# Patient Record
Sex: Male | Born: 1970 | Race: White | Hispanic: No | Marital: Single
Health system: Southern US, Community
[De-identification: ages and names within clinical notes are randomized; demographics above are authoritative.]

---

## 2009-11-23 ENCOUNTER — Ambulatory Visit: Payer: Self-pay | Admitting: Family Medicine

## 2011-12-30 IMAGING — CR DG CHEST 2V
1 series · 2 of 2 positions shown · non-contrast
Comparison: none

REASON FOR EXAM: interlostral  strain
COMMENTS:

PROCEDURE:     MDR - MDR CHEST PA(OR AP) AND LATERAL  - November 23, 2009  [DATE]
RESULT:     The lung fields are clear. No pneumonia, pneumothorax or pleural
effusion is seen. No interstitial or pulmonary edema is noted. The heart is
within normal limits for size.

[Series 1: view not recorded · 0.17mm/px · 2 of 2 slices shown]
[im 1/2]
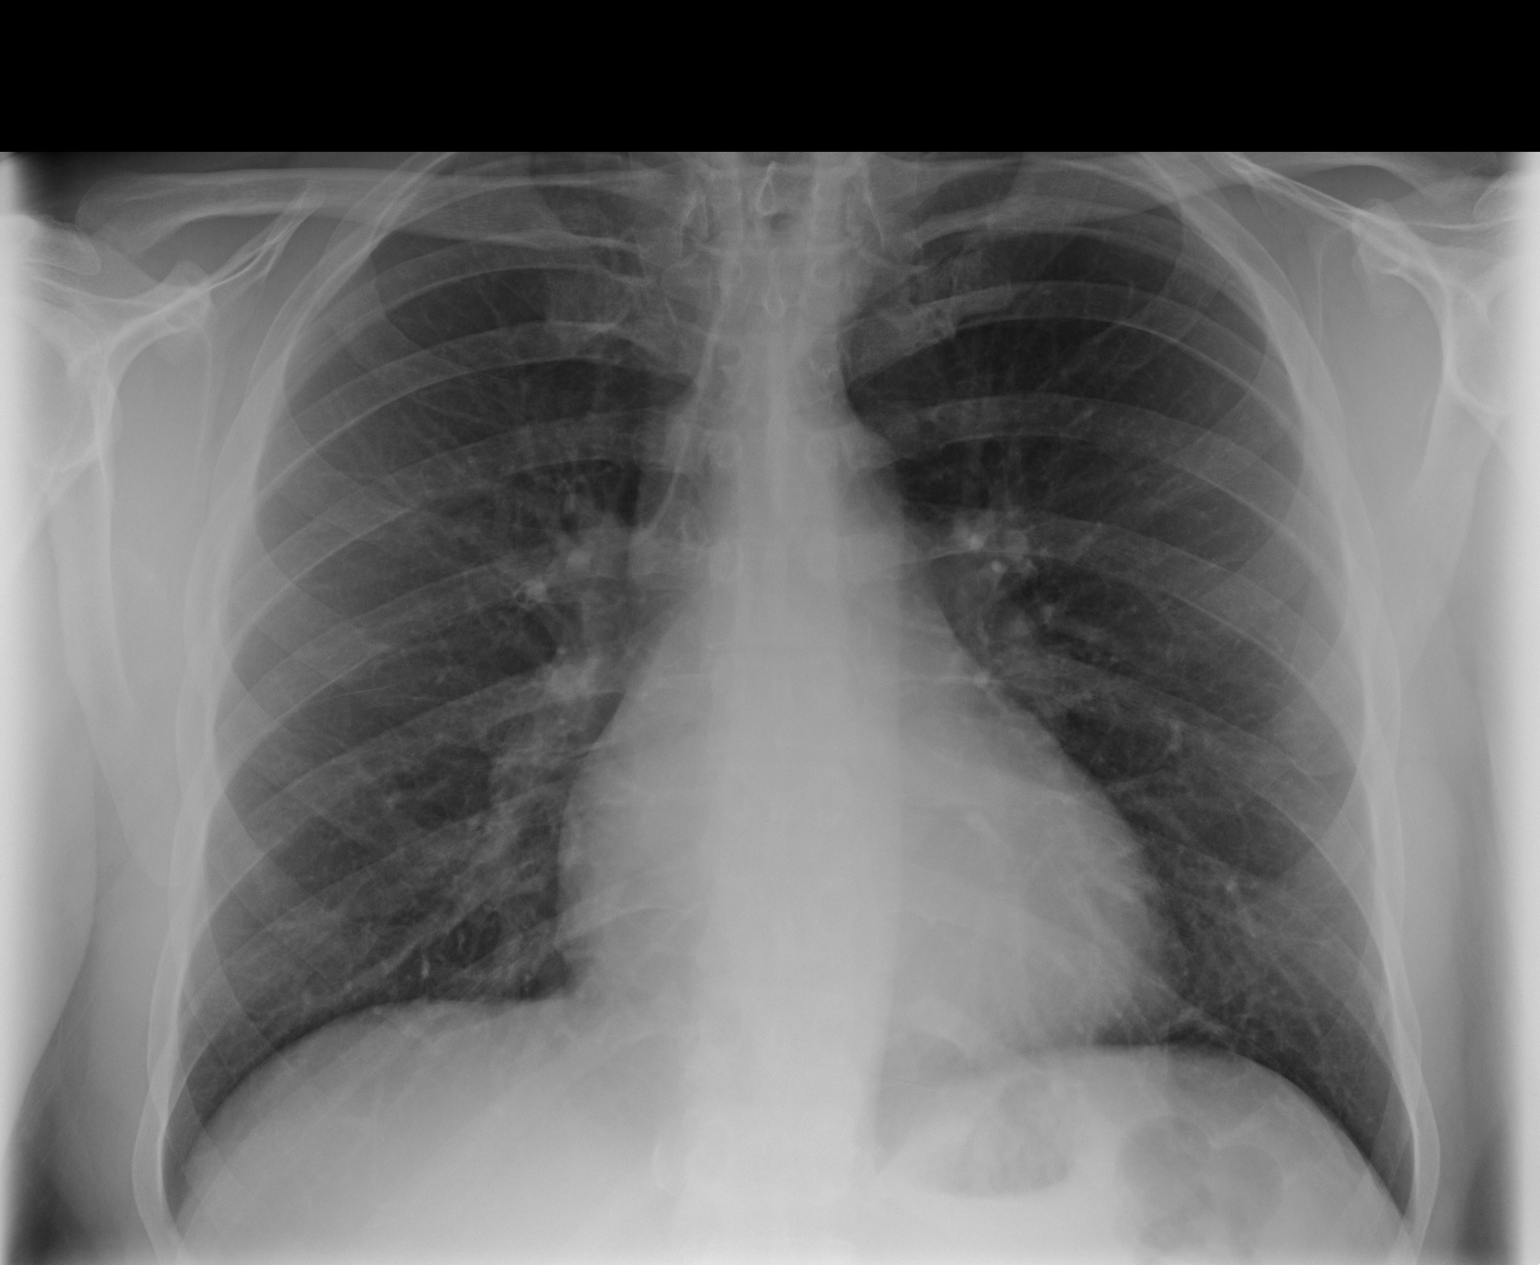
[im 2/2]
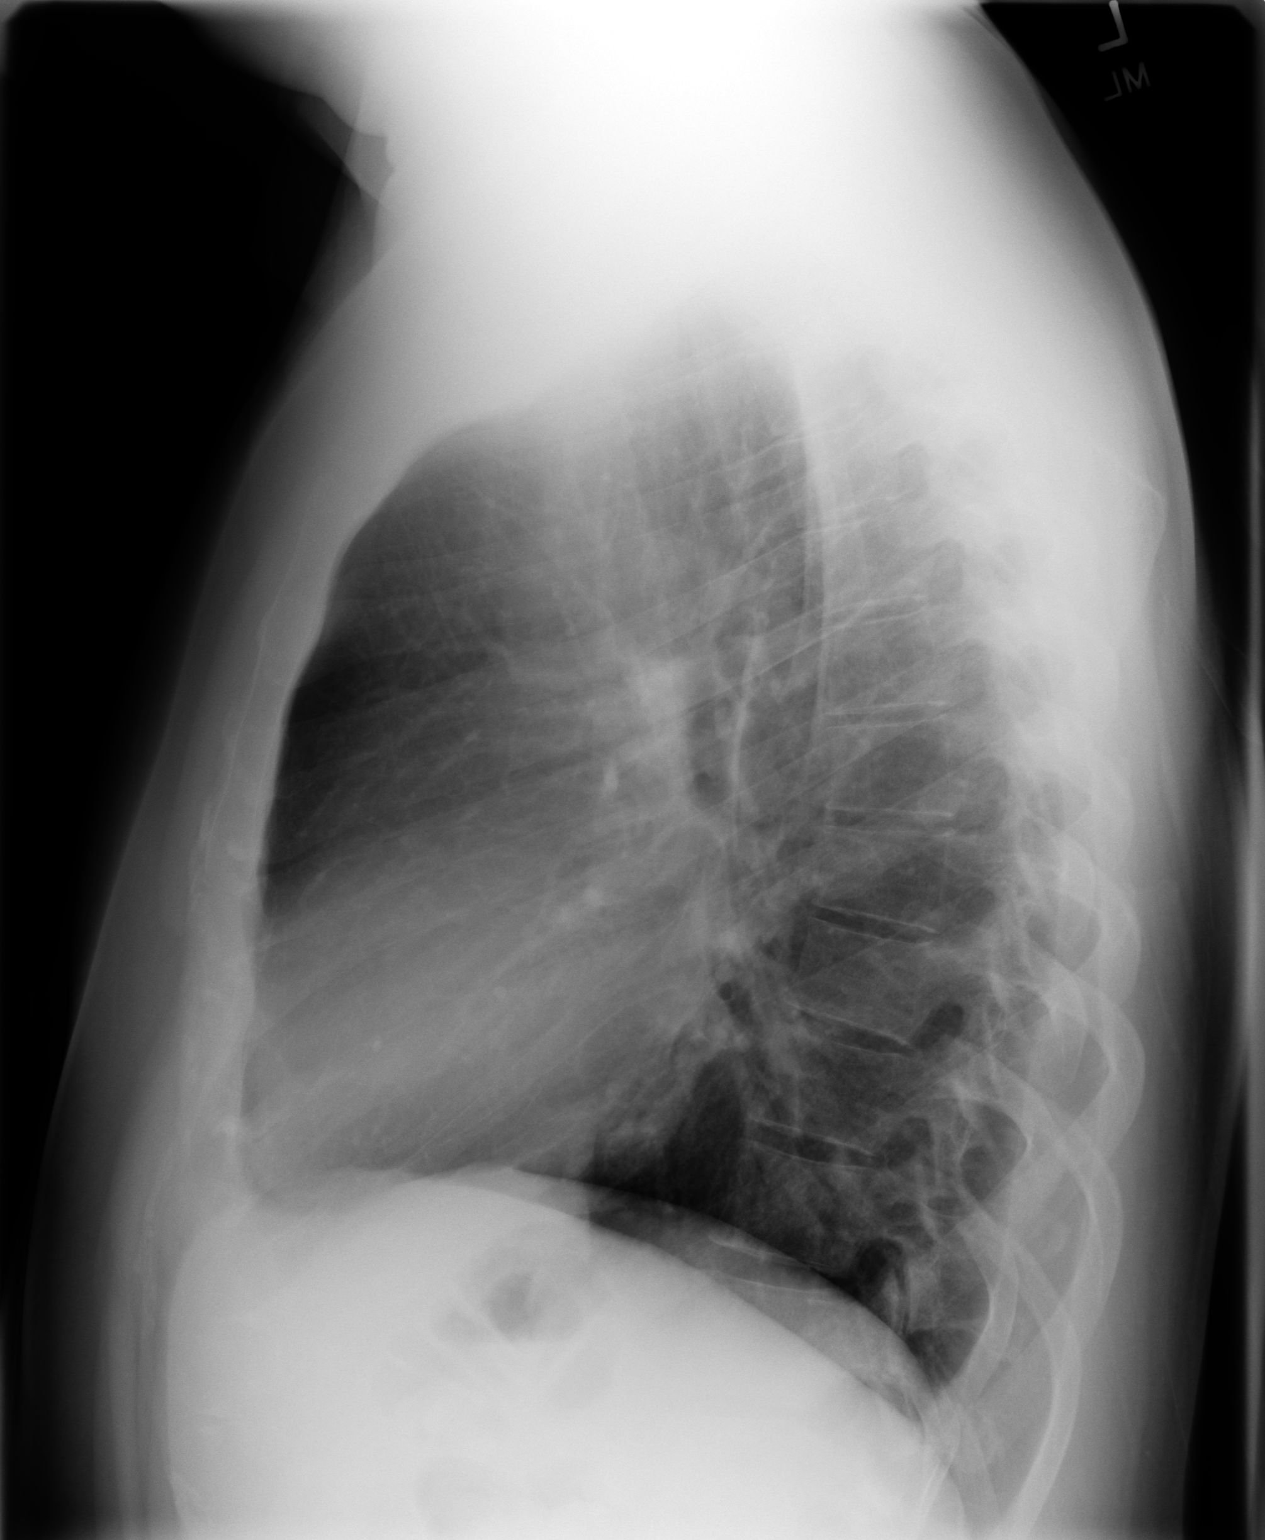

[2 of 2 positions shown; findings below may reference images not displayed]

IMPRESSION: No acute changes are identified.

## 2017-05-23 ENCOUNTER — Ambulatory Visit
Admission: RE | Admit: 2017-05-23 | Discharge: 2017-05-23 | Disposition: A | Discharge status: Home / Self Care | Payer: Worker's Compensation | Source: Ambulatory Visit | Attending: Physician Assistant | Admitting: Physician Assistant

## 2017-05-23 ENCOUNTER — Other Ambulatory Visit: Payer: Self-pay | Admitting: Physician Assistant

## 2017-05-23 DIAGNOSIS — X58XXXA Exposure to other specified factors, initial encounter: Secondary | ICD-10-CM | POA: Diagnosis not present

## 2017-05-23 DIAGNOSIS — S61303A Unspecified open wound of left middle finger with damage to nail, initial encounter: Secondary | ICD-10-CM | POA: Insufficient documentation

## 2017-05-23 DIAGNOSIS — M79645 Pain in left finger(s): Secondary | ICD-10-CM

## 2017-06-02 ENCOUNTER — Encounter: Payer: Worker's Compensation | Attending: Surgery | Admitting: Surgery

## 2017-06-02 DIAGNOSIS — X58XXXA Exposure to other specified factors, initial encounter: Secondary | ICD-10-CM | POA: Diagnosis not present

## 2017-06-02 DIAGNOSIS — F17218 Nicotine dependence, cigarettes, with other nicotine-induced disorders: Secondary | ICD-10-CM | POA: Insufficient documentation

## 2017-06-02 DIAGNOSIS — S62663B Nondisplaced fracture of distal phalanx of left middle finger, initial encounter for open fracture: Secondary | ICD-10-CM | POA: Insufficient documentation

## 2017-06-03 NOTE — Progress Notes (Signed)
[name], Hunter K. ([id number]) Visit Report for 06/02/2017 Abuse/Suicide Risk Screen Details Patient Name: [name], Hunter K. Date of Service: 06/02/2017 9:00 AM Medical Record Number: [id number] Patient Account Number: [id number] Date of Birth/Sex: 01/29/1971 [age] y.o. Male) Treating RN: Curtis Sitesorthy, Joanna Primary Care Adamary Savary: PATIENT, NO Other Clinician: Referring Alahna Dunne: Janalyn HarderSINGER, SAMANTHA Treating Dmetrius Ambs/Extender: Rudene ReBritto, Errol Weeks in Treatment: 0 Abuse/Suicide Risk Screen Items Answer ABUSE/SUICIDE RISK SCREEN: Has anyone close to you tried to hurt or harm you recentlyo No Do you feel uncomfortable with anyone in your familyo No Has anyone forced you do things that you didnot want to doo No Do you have any thoughts of harming yourselfo No Patient displays signs or symptoms of abuse and/or neglect. No Electronic Signature(s) Signed: 06/02/2017 4:57:04 PM By: Curtis Sitesorthy, Joanna Entered By: Curtis Sitesorthy, Joanna on 06/02/2017 08:48:00 [name], Hunter K. ([id number]) -------------------------------------------------------------------------------- Activities of Daily Living Details Patient Name: [name], Hunter K. Date of Service: 06/02/2017 9:00 AM Medical Record Number: [id number] Patient Account Number: [id number] Date of Birth/Sex: 02/27/1971 [age] y.o. Male) Treating RN: Curtis Sitesorthy, Joanna Primary Care Vicie Cech: PATIENT, NO Other Clinician: Referring Caleb Prigmore: Janalyn HarderSINGER, SAMANTHA Treating Kathyjo Briere/Extender: Rudene ReBritto, Errol Weeks in Treatment: 0 Activities of Daily Living Items Answer Activities of Daily Living (Please select one for each item) Drive Automobile Completely Able Take Medications Completely Able Use Telephone Completely Able Care for Appearance Completely Able Use Toilet Completely Able Bath / Shower Completely Able Dress Self Completely Able Feed Self Completely Able Walk Completely Able Get In / Out Bed Completely Able Housework Completely Able Prepare Meals Completely  Able Handle Money Completely Able Shop for Self Completely Able Electronic Signature(s) Signed: 06/02/2017 4:57:04 PM By: Curtis Sitesorthy, Joanna Entered By: Curtis Sitesorthy, Joanna on 06/02/2017 08:48:05 [name], Hunter K. ([id number]) -------------------------------------------------------------------------------- Education Assessment Details Patient Name: [name], Hunter K. Date of Service: 06/02/2017 9:00 AM Medical Record Number: [id number] Patient Account Number: [id number] Date of Birth/Sex: 04/28/1971 [age] y.o. Male) Treating RN: Curtis Sitesorthy, Joanna Primary Care Loleta Frommelt: PATIENT, NO Other Clinician: Referring Miyako Oelke: Janalyn HarderSINGER, SAMANTHA Treating Jaimarie Rapozo/Extender: Rudene ReBritto, Errol Weeks in Treatment: 0 Primary Learner Assessed: Patient Learning Preferences/Education Level/Primary Language Learning Preference: Explanation, Demonstration Highest Education Level: College or Above Preferred Language: English Cognitive Barrier Assessment/Beliefs Language Barrier: No Translator Needed: No Memory Deficit: No Emotional Barrier: No Cultural/Religious Beliefs Affecting Medical Care: No Physical Barrier Assessment Impaired Vision: No Impaired Hearing: No Decreased Hand dexterity: No Knowledge/Comprehension Assessment Knowledge Level: Medium Comprehension Level: Medium Ability to understand written Medium instructions: Ability to understand verbal Medium instructions: Motivation Assessment Anxiety Level: Calm Cooperation: Cooperative Education Importance: Acknowledges Need Interest in Health Problems: Asks Questions Perception: Coherent Willingness to Engage in Self- Medium Management Activities: Readiness to Engage in Self- Medium Management Activities: Electronic Signature(s) Signed: 06/02/2017 4:57:04 PM By: Curtis Sitesorthy, Joanna Entered By: Curtis Sitesorthy, Joanna on 06/02/2017 08:48:09 [name], Hunter K. ([id number]) -------------------------------------------------------------------------------- Fall Risk  Assessment Details Patient Name: [name], Hunter K. Date of Service: 06/02/2017 9:00 AM Medical Record Number: [id number] Patient Account Number: [id number] Date of Birth/Sex: 06/26/1970 [age] y.o. Male) Treating RN: Curtis Sitesorthy, Joanna Primary Care Lamere Lightner: PATIENT, NO Other Clinician: Referring Geronimo Diliberto: Janalyn HarderSINGER, SAMANTHA Treating Chizuko Trine/Extender: Rudene ReBritto, Errol Weeks in Treatment: 0 Fall Risk Assessment Items Have you had 2 or more falls in the last 12 monthso 0 No Have you had any fall that resulted in injury in the last 12 monthso 0 No FALL RISK ASSESSMENT: History of falling - immediate or within 3 months 0 No Secondary diagnosis 0 No Ambulatory aid None/bed rest/wheelchair/nurse 0 Yes Crutches/cane/walker 0 No Furniture 0 No IV Access/Saline  Lock 0 No Gait/Training Normal/bed rest/immobile 0 Yes Weak 0 No Impaired 0 No Mental Status Oriented to own ability 0 Yes Electronic Signature(s) Signed: 06/02/2017 4:57:04 PM By: Curtis Sitesorthy, Joanna Entered By: Curtis Sitesorthy, Joanna on 06/02/2017 08:48:13 [name], Radford K. ([id number]) -------------------------------------------------------------------------------- Foot Assessment Details Patient Name: [name], Vale K. Date of Service: 06/02/2017 9:00 AM Medical Record Number: [id number] Patient Account Number: [id number] Date of Birth/Sex: 08/05/1970 [age] y.o. Male) Treating RN: Curtis Sitesorthy, Joanna Primary Care Demontrae Gilbert: PATIENT, NO Other Clinician: Referring Jobina Maita: Janalyn HarderSINGER, SAMANTHA Treating Aurora Rody/Extender: Rudene ReBritto, Errol Weeks in Treatment: 0 Foot Assessment Items Site Locations + = Sensation present, - = Sensation absent, C = Callus, U = Ulcer R = Redness, W = Warmth, M = Maceration, PU = Pre-ulcerative lesion F = Fissure, S = Swelling, D = Dryness Assessment Right: Left: Other Deformity: No No Prior Foot Ulcer: No No Prior Amputation: No No Charcot Joint: No No Ambulatory Status: Ambulatory Without Help Gait: Steady Electronic  Signature(s) Signed: 06/02/2017 4:57:04 PM By: Curtis Sitesorthy, Joanna Entered By: Curtis Sitesorthy, Joanna on 06/02/2017 08:48:37 [name], Bradin K. ([id number]) -------------------------------------------------------------------------------- Nutrition Risk Assessment Details Patient Name: [name], Tucker K. Date of Service: 06/02/2017 9:00 AM Medical Record Number: [id number] Patient Account Number: [id number] Date of Birth/Sex: 07/14/1970 [age] y.o. Male) Treating RN: Curtis Sitesorthy, Joanna Primary Care Lavenia Stumpo: PATIENT, NO Other Clinician: Referring Shamiracle Gorden: Janalyn HarderSINGER, SAMANTHA Treating Maison Agrusa/Extender: Rudene ReBritto, Errol Weeks in Treatment: 0 Height (in): 76 Weight (lbs): 235 Body Mass Index (BMI): 28.6 Nutrition Risk Assessment Items NUTRITION RISK SCREEN: I have an illness or condition that made me change the kind and/or amount of 0 No food I eat I eat fewer than two meals per day 0 No I eat few fruits and vegetables, or milk products 0 No I have three or more drinks of beer, liquor or wine almost every day 0 No I have tooth or mouth problems that make it hard for me to eat 0 No I don't always have enough money to buy the food I need 0 No I eat alone most of the time 0 No I take three or more different prescribed or over-the-counter drugs a day 0 No Without wanting to, I have lost or gained 10 pounds in the last six months 0 No I am not always physically able to shop, cook and/or feed myself 0 No Nutrition Protocols Good Risk Protocol 0 No interventions needed Moderate Risk Protocol Electronic Signature(s) Signed: 06/02/2017 4:57:04 PM By: Curtis Sitesorthy, Joanna Entered By: Curtis Sitesorthy, Joanna on 06/02/2017 08:48:17

## 2017-06-03 NOTE — Progress Notes (Signed)
NORI, POLAND ([id number]) Visit Report for 06/02/2017 Allergy List Details Patient Name: [name], [name]. Date of Service: 06/02/2017 9:00 AM Medical Record Number: [id number] Patient Account Number: [id number] Date of Birth/Sex: 10-Jul-1970 (46 y.o. Male) Treating RN: Curtis Sites Primary Care Dandra Shambaugh: PATIENT, NO Other Clinician: Referring Conlan Miceli: Janalyn Harder Treating Blinda Turek/Extender: Rudene Re in Treatment: 0 Allergies Active Allergies No Known Allergies Allergy Notes Electronic Signature(s) Signed: 06/02/2017 4:57:04 PM By: Curtis Sites Entered By: Curtis Sites on 06/02/2017 08:42:07 [name] ([id number]) -------------------------------------------------------------------------------- Arrival Information Details Patient Name: [name] Date of Service: 06/02/2017 9:00 AM Medical Record Number: [id number] Patient Account Number: [id number] Date of Birth/Sex: [birth year] (46 y.o. Male) Treating RN: Curtis Sites Primary Care Shadrick Senne: PATIENT, NO Other Clinician: Referring Omaya Nieland: Janalyn Harder Treating Khristian Seals/Extender: Rudene Re in Treatment: 0 Visit Information Patient Arrived: Ambulatory Arrival Time: 08:37 Accompanied By: self Transfer Assistance: None Patient Identification Verified: Yes Secondary Verification Process Completed: Yes Electronic Signature(s) Signed: 06/02/2017 4:57:04 PM By: Curtis Sites Entered By: Curtis Sites on 06/02/2017 08:37:58 [name] ([id number]) -------------------------------------------------------------------------------- Clinic Level of Care Assessment Details Patient Name: [name] Date of Service: 06/02/2017 9:00 AM Medical Record Number: [id number] Patient Account Number: [id number] Date of Birth/Sex: [birth year] (46 y.o. Male) Treating RN: Curtis Sites Primary Care Radhika Dershem: PATIENT, NO Other Clinician: Referring Allante Beane: Janalyn Harder Treating  Laporcha Marchesi/Extender: Rudene Re in Treatment: 0 Clinic Level of Care Assessment Items TOOL 2 Quantity Score []  - Use when only an EandM is performed on the INITIAL visit 0 ASSESSMENTS - Nursing Assessment / Reassessment X - General Physical Exam (combine w/ comprehensive assessment (listed just below) when 1 20 performed on new pt. evals) X- 1 25 Comprehensive Assessment (HX, ROS, Risk Assessments, Wounds Hx, etc.) ASSESSMENTS - Wound and Skin Assessment / Reassessment X - Simple Wound Assessment / Reassessment - one wound 1 5 []  - 0 Complex Wound Assessment / Reassessment - multiple wounds []  - 0 Dermatologic / Skin Assessment (not related to wound area) ASSESSMENTS - Ostomy and/or Continence Assessment and Care []  - Incontinence Assessment and Management 0 []  - 0 Ostomy Care Assessment and Management (repouching, etc.) PROCESS - Coordination of Care X - Simple Patient / Family Education for ongoing care 1 15 []  - 0 Complex (extensive) Patient / Family Education for ongoing care X- 1 10 Staff obtains Chiropractor, Records, Test Results / Process Orders []  - 0 Staff telephones HHA, Nursing Homes / Clarify orders / etc []  - 0 Routine Transfer to another Facility (non-emergent condition) []  - 0 Routine Hospital Admission (non-emergent condition) X- 1 15 New Admissions / Manufacturing engineer / Ordering NPWT, Apligraf, etc. []  - 0 Emergency Hospital Admission (emergent condition) X- 1 10 Simple Discharge Coordination []  - 0 Complex (extensive) Discharge Coordination PROCESS - Special Needs []  - Pediatric / Minor Patient Management 0 []  - 0 Isolation Patient Management OAK, DOREY ([id number]) []  - 0 Hearing / Language / Visual special needs []  - 0 Assessment of Community assistance (transportation, D/C planning, etc.) []  - 0 Additional assistance / Altered mentation []  - 0 Support Surface(s) Assessment (bed, cushion, seat, etc.) INTERVENTIONS - Wound  Cleansing / Measurement X - Wound Imaging (photographs - any number of wounds) 1 5 []  - 0 Wound Tracing (instead of photographs) X- 1 5 Simple Wound Measurement - one wound []  - 0 Complex Wound Measurement - multiple wounds X- 1 5 Simple Wound Cleansing - one wound []  - 0 Complex Wound Cleansing - multiple  wounds INTERVENTIONS - Wound Dressings X - Small Wound Dressing one or multiple wounds 1 10 []  - 0 Medium Wound Dressing one or multiple wounds []  - 0 Large Wound Dressing one or multiple wounds []  - 0 Application of Medications - injection INTERVENTIONS - Miscellaneous []  - External ear exam 0 []  - 0 Specimen Collection (cultures, biopsies, blood, body fluids, etc.) []  - 0 Specimen(s) / Culture(s) sent or taken to Lab for analysis []  - 0 Patient Transfer (multiple staff / Michiel SitesHoyer Lift / Similar devices) []  - 0 Simple Staple / Suture removal (25 or less) []  - 0 Complex Staple / Suture removal (26 or more) []  - 0 Hypo / Hyperglycemic Management (close monitor of Blood Glucose) []  - 0 Ankle / Brachial Index (ABI) - do not check if billed separately Has the patient been seen at the hospital within the last three years: Yes Total Score: 125 Level Of Care: New/Established - Level 4 Electronic Signature(s) Signed: 06/02/2017 4:57:04 PM By: Curtis Sitesorthy, Joanna Entered By: Curtis Sitesorthy, Joanna on 06/02/2017 10:40:24 Hunter ClarksSTACEY, Hunter K. ([id number]) -------------------------------------------------------------------------------- Encounter Discharge Information Details Patient Name: Hunter ClarksSTACEY, Hunter K. Date of Service: 06/02/2017 9:00 AM Medical Record Number: [id number] Patient Account Number: [id number] Date of Birth/Sex: 07/04/1970 [age] y.o. Male) Treating RN: Curtis Sitesorthy, Joanna Primary Care Rmani Kapusta: PATIENT, NO Other Clinician: Referring Khamiyah Grefe: Janalyn HarderSINGER, SAMANTHA Treating Malaiya Paczkowski/Extender: Rudene ReBritto, Errol Weeks in Treatment: 0 Encounter Discharge Information Items Discharge Pain Level:  0 Discharge Condition: Stable Ambulatory Status: Ambulatory Discharge Destination: Home Transportation: Private Auto Accompanied By: self Schedule Follow-up Appointment: Yes Medication Reconciliation completed and No provided to Patient/Care Giuseppe Duchemin: Provided on Clinical Summary of Care: 06/02/2017 Form Type Recipient Paper Patient TS Electronic Signature(s) Signed: 06/02/2017 10:41:23 AM By: Curtis Sitesorthy, Joanna Entered By: Curtis Sitesorthy, Joanna on 06/02/2017 10:41:22 Hunter ClarksSTACEY, Hunter K. ([id number]) -------------------------------------------------------------------------------- Multi Wound Chart Details Patient Name: Hunter ClarksSTACEY, Hunter K. Date of Service: 06/02/2017 9:00 AM Medical Record Number: [id number] Patient Account Number: [id number] Date of Birth/Sex: 07/20/1970 [age] y.o. Male) Treating RN: Curtis Sitesorthy, Joanna Primary Care Noeh Sparacino: PATIENT, NO Other Clinician: Referring Jamayah Myszka: Janalyn HarderSINGER, SAMANTHA Treating Elgar Scoggins/Extender: Rudene ReBritto, Errol Weeks in Treatment: 0 Vital Signs Height(in): 76 Pulse(bpm): 68 Weight(lbs): 235 Blood Pressure(mmHg): 160/90 Body Mass Index(BMI): 29 Temperature(F): 98.2 Respiratory Rate 18 (breaths/min): Photos: [1:No Photos] [N/A:N/A] Wound Location: [1:Left Hand - 3rd Digit] [N/A:N/A] Wounding Event: [1:Trauma] [N/A:N/A] Primary Etiology: [1:Trauma, Other] [N/A:N/A] Date Acquired: [1:05/23/2017] [N/A:N/A] Weeks of Treatment: [1:0] [N/A:N/A] Wound Status: [1:Open] [N/A:N/A] Pending Amputation on [1:Yes] [N/A:N/A] Presentation: Measurements L x W x D [1:3x1.5x0.1] [N/A:N/A] (cm) Area (cm) : [1:3.534] [N/A:N/A] Volume (cm) : [1:0.353] [N/A:N/A] Classification: [1:Full Thickness With Exposed Support Structures] [N/A:N/A] Exudate Amount: [1:Large] [N/A:N/A] Exudate Type: [1:Serous] [N/A:N/A] Exudate Color: [1:amber] [N/A:N/A] Wound Margin: [1:Flat and Intact] [N/A:N/A] Granulation Amount: [1:Large (67-100%)] [N/A:N/A] Granulation Quality: [1:Red]  [N/A:N/A] Necrotic Amount: [1:Small (1-33%)] [N/A:N/A] Exposed Structures: [1:Fat Layer (Subcutaneous Tissue) Exposed: Yes Fascia: No Tendon: No Muscle: No Joint: No Bone: No] [N/A:N/A] Epithelialization: [1:None] [N/A:N/A] Periwound Skin Texture: [1:Excoriation: No Induration: No Callus: No Crepitus: No Rash: No Scarring: No] [N/A:N/A] Periwound Skin Moisture: [N/A:N/A] Maceration: Yes Dry/Scaly: No Periwound Skin Color: Atrophie Blanche: No N/A N/A Cyanosis: No Ecchymosis: No Erythema: No Hemosiderin Staining: No Mottled: No Pallor: No Rubor: No Temperature: No Abnormality N/A N/A Tenderness on Palpation: Yes N/A N/A Wound Preparation: Ulcer Cleansing: N/A N/A Rinsed/Irrigated with Saline Topical Anesthetic Applied: Other: lidocaine 4% Treatment Notes Electronic Signature(s) Signed: 06/02/2017 4:57:04 PM By: Curtis Sitesorthy, Joanna Previous Signature: 06/02/2017 9:45:05 AM Version By: Evlyn KannerBritto, Errol MD, FACS Entered  By: Curtis Sitesorthy, Joanna on 06/02/2017 10:27:56 Hunter ClarksSTACEY, Hunter K. ([id number]) -------------------------------------------------------------------------------- Multi-Disciplinary Care Plan Details Patient Name: Hunter ClarksSTACEY, Hunter K. Date of Service: 06/02/2017 9:00 AM Medical Record Number: [id number] Patient Account Number: [id number] Date of Birth/Sex: 10/01/1970 [age] y.o. Male) Treating RN: Curtis Sitesorthy, Joanna Primary Care Ruthy Forry: PATIENT, NO Other Clinician: Referring Demetrica Zipp: Janalyn HarderSINGER, SAMANTHA Treating Wissam Resor/Extender: Rudene ReBritto, Errol Weeks in Treatment: 0 Active Inactive ` Orientation to the Wound Care Program Nursing Diagnoses: Knowledge deficit related to the wound healing center program Goals: Patient/caregiver will verbalize understanding of the Wound Healing Center Program Date Initiated: 06/02/2017 Target Resolution Date: 08/12/2017 Goal Status: Active Interventions: Provide education on orientation to the wound center Notes: ` Wound/Skin Impairment Nursing  Diagnoses: Impaired tissue integrity Goals: Ulcer/skin breakdown will heal within 14 weeks Date Initiated: 06/02/2017 Target Resolution Date: 08/12/2017 Goal Status: Active Interventions: Assess patient/caregiver ability to obtain necessary supplies Assess patient/caregiver ability to perform ulcer/skin care regimen upon admission and as needed Assess ulceration(s) every visit Notes: Electronic Signature(s) Signed: 06/02/2017 4:57:04 PM By: Curtis Sitesorthy, Joanna Entered By: Curtis Sitesorthy, Joanna on 06/02/2017 10:27:35 Hunter ClarksSTACEY, Hasaan K. ([id number]) -------------------------------------------------------------------------------- Pain Assessment Details Patient Name: Hunter ClarksSTACEY, Hunter K. Date of Service: 06/02/2017 9:00 AM Medical Record Number: [id number] Patient Account Number: [id number] Date of Birth/Sex: 08/07/1970 [age] y.o. Male) Treating RN: Curtis Sitesorthy, Joanna Primary Care Cristie Mckinney: PATIENT, NO Other Clinician: Referring Derrian Poli: Janalyn HarderSINGER, SAMANTHA Treating Kalla Watson/Extender: Rudene ReBritto, Errol Weeks in Treatment: 0 Active Problems Location of Pain Severity and Description of Pain Patient Has Paino Yes Site Locations Pain Location: Pain in Ulcers With Dressing Change: Yes Duration of the Pain. Constant / Intermittento Intermittent Pain Management and Medication Current Pain Management: Notes Topical or injectable lidocaine is offered to patient for acute pain when surgical debridement is performed. If needed, Patient is instructed to use over the counter pain medication for the following 24-48 hours after debridement. Wound care MDs do not prescribed pain medications. Patient has chronic pain or uncontrolled pain. Patient has been instructed to make an appointment with their Primary Care Physician for pain management. Electronic Signature(s) Signed: 06/02/2017 4:57:04 PM By: Curtis Sitesorthy, Joanna Entered By: Curtis Sitesorthy, Joanna on 06/02/2017 08:38:44 Hunter ClarksSTACEY, Hunter K.  ([id number]) -------------------------------------------------------------------------------- Patient/Caregiver Education Details Patient Name: Hunter ClarksSTACEY, Hunter K. Date of Service: 06/02/2017 9:00 AM Medical Record Number: [id number] Patient Account Number: [id number] Date of Birth/Gender: 08/16/1970 [age] y.o. Male) Treating RN: Curtis Sitesorthy, Joanna Primary Care Physician: PATIENT, NO Other Clinician: Referring Physician: Janalyn HarderSINGER, SAMANTHA Treating Physician/Extender: Rudene ReBritto, Errol Weeks in Treatment: 0 Education Assessment Education Provided To: Patient Education Topics Provided Wound/Skin Impairment: Handouts: Other: wound care as ordered Methods: Demonstration, Explain/Verbal Responses: State content correctly Electronic Signature(s) Signed: 06/02/2017 4:57:04 PM By: Curtis Sitesorthy, Joanna Entered By: Curtis Sitesorthy, Joanna on 06/02/2017 10:41:37 Hunter ClarksSTACEY, Hunter K. ([id number]) -------------------------------------------------------------------------------- Wound Assessment Details Patient Name: Hunter ClarksSTACEY, Leovanni K. Date of Service: 06/02/2017 9:00 AM Medical Record Number: [id number] Patient Account Number: [id number] Date of Birth/Sex: 11/19/1970 [age] y.o. Male) Treating RN: Curtis Sitesorthy, Joanna Primary Care Malique Driskill: PATIENT, NO Other Clinician: Referring Rutherford Alarie: Janalyn HarderSINGER, SAMANTHA Treating Hisae Decoursey/Extender: Rudene ReBritto, Errol Weeks in Treatment: 0 Wound Status Wound Number: 1 Primary Etiology: Trauma, Other Wound Location: Left Hand - 3rd Digit Wound Status: Open Wounding Event: Trauma Date Acquired: 05/23/2017 Weeks Of Treatment: 0 Clustered Wound: No Pending Amputation On Presentation Photos Photo Uploaded By: Curtis Sitesorthy, Joanna on 06/02/2017 11:18:06 Wound Measurements Length: (cm) 3 Width: (cm) 1.5 Depth: (cm) 0.1 Area: (cm) 3.534 Volume: (cm) 0.353 % Reduction in Area: % Reduction in Volume: Epithelialization: None Tunneling: No Undermining: No Wound Description Full Thickness With  Exposed  Support Classification: Structures Wound Margin: Flat and Intact Exudate Large Amount: Exudate Type: Serous Exudate Color: amber Foul Odor After Cleansing: No Slough/Fibrino Yes Wound Bed Granulation Amount: Large (67-100%) Exposed Structure Granulation Quality: Red Fascia Exposed: No Necrotic Amount: Small (1-33%) Fat Layer (Subcutaneous Tissue) Exposed: Yes Necrotic Quality: Adherent Slough Tendon Exposed: No Muscle Exposed: No Joint Exposed: No ELIGIO, ANGERT. ([id number]) Bone Exposed: No Periwound Skin Texture Texture Color No Abnormalities Noted: No No Abnormalities Noted: No Callus: No Atrophie Blanche: No Crepitus: No Cyanosis: No Excoriation: No Ecchymosis: No Induration: No Erythema: No Rash: No Hemosiderin Staining: No Scarring: No Mottled: No Pallor: No Moisture Rubor: No No Abnormalities Noted: No Dry / Scaly: No Temperature / Pain Maceration: Yes Temperature: No Abnormality Tenderness on Palpation: Yes Wound Preparation Ulcer Cleansing: Rinsed/Irrigated with Saline Topical Anesthetic Applied: Other: lidocaine 4%, Treatment Notes Wound #1 (Left Hand - 3rd Digit) 1. Cleansed with: Clean wound with Normal Saline 2. Anesthetic Topical Lidocaine 4% cream to wound bed prior to debridement 4. Dressing Applied: Other dressing (specify in notes) 5. Secondary Dressing Applied Gauze and Kerlix/Conform 7. Secured with Tape Notes silvercel, netting Electronic Signature(s) Signed: 06/02/2017 4:57:04 PM By: Curtis Sites Entered By: Curtis Sites on 06/02/2017 08:59:44 [name] ([id number]) -------------------------------------------------------------------------------- Vitals Details Patient Name: [name] Date of Service: 06/02/2017 9:00 AM Medical Record Number: [id number] Patient Account Number: [id number] Date of Birth/Sex: 1970/06/25 (46 y.o. Male) Treating RN: Curtis Sites Primary Care Jianni Batten: PATIENT, NO Other  Clinician: Referring Aparna Vanderweele: Janalyn Harder Treating Kimberlea Schlag/Extender: Rudene Re in Treatment: 0 Vital Signs Time Taken: 08:38 Temperature (F): 98.2 Height (in): 76 Pulse (bpm): 68 Source: Measured Respiratory Rate (breaths/min): 18 Weight (lbs): 235 Blood Pressure (mmHg): 160/90 Source: Measured Reference Range: 80 - 120 mg / dl Body Mass Index (BMI): 28.6 Electronic Signature(s) Signed: 06/02/2017 4:57:04 PM By: Curtis Sites Entered By: Curtis Sites on 06/02/2017 08:41:48

## 2017-06-03 NOTE — Progress Notes (Signed)
[name], Krish K. ([id number]) Visit Report for 06/02/2017 Chief Complaint Document Details Patient Name: [name], Hunter K. Date of Service: 06/02/2017 9:00 AM Medical Record Number: [id number] Patient Account Number: [id number] Date of Birth/Sex: 03/25/1971 19(46 y.o. Male) Treating RN: Curtis Sitesorthy, Joanna Primary Care Provider: PATIENT, NO Other Clinician: Referring Provider: Janalyn HarderSINGER, SAMANTHA Treating Provider/Extender: Rudene ReBritto, Yolanda Huffstetler Weeks in Treatment: 0 Information Obtained from: Patient Chief Complaint Patient seen for complaints of Non-Healing Wound to the distal phalanx of the left middle finger which she's had since 05/23/2017 Electronic Signature(s) Signed: 06/02/2017 9:45:30 AM By: Evlyn KannerBritto, Sagal Gayton MD, FACS Entered By: Evlyn KannerBritto, Tamekia Rotter on 06/02/2017 09:45:30 [name], Raphael K. ([id number]) -------------------------------------------------------------------------------- HPI Details Patient Name: [name], Hunter K. Date of Service: 06/02/2017 9:00 AM Medical Record Number: [id number] Patient Account Number: [id number] Date of Birth/Sex: 09/29/1970 [age] y.o. Male) Treating RN: Curtis Sitesorthy, Joanna Primary Care Provider: PATIENT, NO Other Clinician: Referring Provider: Janalyn HarderSINGER, SAMANTHA Treating Provider/Extender: Rudene ReBritto, [place]ath Madding Weeks in Treatment: 0 History of Present Illness Location: and left middle finger Quality: Patient reports experiencing a dull pain to affected area(s). Severity: Patient states wound are getting worse. Duration: Patient has had the wound for < 2 weeks prior to presenting for treatment Timing: Pain in wound is Intermittent (comes and goes Context: The wound occurred when the patient got his finger crushed in a grinder while he was at work and has [place] [employer] [place] [employer] [place]. Modifying Factors: Other treatment(s) tried include:x-ray taken and put on an antibiotic Associated Signs and Symptoms: Patient reports having increase swelling. HPI Description: this  46 year old young man was referred by the [employer] team at [facility]  and has a injury to the left middle finger distal phalanx on 05/23/2017. He sustained a crush injury in a grinder and the distal tip of the left middle finger was ripped and torn off including the fingernail. He had an x-ray of the left hand IMPRESSION:1. Middle finger distal soft tissue laceration with loss of soft tissue to the finger tip. Subtle evidence of minimal fracturing of the distal margin of the distal tuft. No other fracture. No dislocation. No radiopaque foreign body. the patient does not have any significant past medical history but is a smoker and smokes about a pack of cigarettes a day. The patient was prescribed Cefadroxil 500 mg twice a day for 7 days and given some dressing changes and asked to see the wound care. the patient was not referred to hand a Engineer, petroleumplastic surgeon. Electronic Signature(s) Signed: 06/02/2017 9:45:35 AM By: Evlyn KannerBritto, Debhora Titus MD, FACS Previous Signature: 06/02/2017 9:32:32 AM Version By: Evlyn KannerBritto, Renn Dirocco MD, FACS Entered By: Evlyn KannerBritto, Woodard Perrell on 06/02/2017 09:45:34 [name], Tregan K. ([id number]) -------------------------------------------------------------------------------- Physical Exam Details Patient Name: [name], Hunter K. Date of Service: 06/02/2017 9:00 AM Medical Record Number: [id number] Patient Account Number: [id number] Date of Birth/Sex: 08/06/1970 [age] y.o. Male) Treating RN: Curtis Sitesorthy, Joanna Primary Care Provider: PATIENT, NO Other Clinician: Referring Provider: Janalyn HarderSINGER, SAMANTHA Treating Provider/Extender: Rudene ReBritto, Casey Maxfield Weeks in Treatment: 0 Constitutional . Pulse regular. Respirations normal and unlabored. Afebrile. . Eyes Nonicteric. Reactive to light. Ears, Nose, Mouth, and Throat Lips, teeth, and gums WNL.Marland Kitchen. Moist mucosa without lesions. Neck supple and nontender. No palpable supraclavicular or cervical  adenopathy. Normal sized without goiter. Respiratory WNL. No retractions.. Cardiovascular Pedal Pulses WNL. No clubbing, cyanosis or edema. Gastrointestinal (GI) Abdomen without masses or tenderness.. No liver or spleen enlargement or tenderness.. Lymphatic No adneopathy. No adenopathy. No adenopathy. Musculoskeletal Adexa without tenderness or enlargement.. Digits and  nails w/o clubbing, cyanosis, infection, petechiae, ischemia, or inflammatory conditions.. Integumentary (Hair, Skin) No suspicious lesions. No crepitus or fluctuance. No peri-wound warmth or erythema. No masses.Marland Kitchen Psychiatric Judgement and insight Intact.. No evidence of depression, anxiety, or agitation.. Notes the patient has a degloving injury of the distal phalanx of the left middle finger and has no nail. No tendons or bone seen or palpated. He does not have any evidence of restricted range of movement except for the distal phalanx. No debridement required. Electronic Signature(s) Signed: 06/02/2017 9:46:42 AM By: Evlyn Kanner MD, FACS Entered By: Evlyn Kanner on 06/02/2017 09:46:41 Hunter Clarks ([id number]) -------------------------------------------------------------------------------- Physician Orders Details Patient Name: [name]. Date of Service: 06/02/2017 9:00 AM Medical Record Number: [id number] Patient Account Number: [id number] Date of Birth/Sex: 1971-05-24 (46 y.o. Male) Treating RN: Curtis Sites Primary Care Provider: PATIENT, NO Other Clinician: Referring Provider: Janalyn Harder Treating Provider/Extender: Rudene Re in Treatment: 0 Verbal / Phone Orders: No Diagnosis Coding Wound Cleansing Wound #1 Left Hand - 3rd Digit o Clean wound with Normal Saline. o May Shower, gently pat wound dry prior to applying new dressing. Primary Wound Dressing Wound #1 Left Hand - 3rd Digit o Silvercel Non-Adherent Secondary Dressing Wound #1 Left Hand - 3rd Digit o Gauze  and Kerlix/Conform Dressing Change Frequency Wound #1 Left Hand - 3rd Digit o Change dressing every day. Follow-up Appointments Wound #1 Left Hand - 3rd Digit o Return Appointment in 1 week. Additional Orders / Instructions Wound #1 Left Hand - 3rd Digit o Stop Smoking o Vitamin A; Vitamin C, Zinc - over the counter supplements o Increase protein intake. Consults o General Surgery - [facility] to [facility] Electronic Signature(s) Signed: 06/02/2017 1:42:42 PM By: Evlyn Kanner MD, FACS Signed: 06/02/2017 4:57:04 PM By: Curtis Sites Entered By: Curtis Sites on 06/02/2017 09:29:50 Hunter Clarks ([id number]) -------------------------------------------------------------------------------- Problem List Details Patient Name: EMMANUEL, GRUENHAGEN. Date of Service: 06/02/2017 9:00 AM Medical Record Number: [id number] Patient Account Number: [id number] Date of Birth/Sex: [birth year] (46 y.o. Male) Treating RN: Curtis Sites Primary Care Provider: PATIENT, NO Other Clinician: Referring Provider: Janalyn Harder Treating Provider/Extender: Rudene Re in Treatment: 0 Active Problems ICD-10 Encounter Code Description Active Date Diagnosis S61.303A Unspecified open wound of left middle finger with damage to nail, 06/02/2017 Yes initial encounter S62.663B Nondisplaced fracture of distal phalanx of left middle finger, initial 06/02/2017 Yes encounter for open fracture F17.218 Nicotine dependence, cigarettes, with other nicotine-induced 06/02/2017 Yes disorders Inactive Problems Resolved Problems Electronic Signature(s) Signed: 06/02/2017 9:44:54 AM By: Evlyn Kanner MD, FACS Entered By: Evlyn Kanner on 06/02/2017 09:44:54 Hunter Clarks ([id number]) -------------------------------------------------------------------------------- Progress Note Details Patient Name: Hunter Clarks Date of Service: 06/02/2017 9:00 AM Medical Record Number:  [id number] Patient Account Number: [id number] Date of Birth/Sex: [birth year] (46 y.o. Male) Treating RN: Curtis Sites Primary Care Provider: PATIENT, NO Other Clinician: Referring Provider: Janalyn Harder Treating Provider/Extender: Rudene Re in Treatment: 0 Subjective Chief Complaint Information obtained from Patient Patient seen for complaints of Non-Healing Wound to the distal phalanx of the left middle finger which she's had since 05/23/2017 History of Present Illness (HPI) The following HPI elements were documented for the patient's wound: Location: and left middle finger Quality: Patient reports experiencing a dull pain to affected area(s). Severity: Patient states wound are getting worse. Duration: Patient has had the wound for < 2 weeks prior to presenting for treatment Timing: Pain in wound is Intermittent (comes and goes Context: The wound occurred when  the patient got his finger crushed in a grinder while he was at work and has seen the [employer] unit at [employer]. Modifying Factors: Other treatment(s) tried include:x-ray taken and put on an antibiotic Associated Signs and Symptoms: Patient reports having increase swelling. this 46 year old young man was referred by the [employer] team at [facility] employee health and wellness at [facility] and has a injury to the left middle finger distal phalanx on 05/23/2017. He sustained a crush injury in a grinder and the distal tip of the left middle finger was ripped and torn off including the fingernail. He had an x-ray of the left hand IMPRESSION:1. Middle finger distal soft tissue laceration with loss of soft tissue to the finger tip. Subtle evidence of minimal fracturing of the distal margin of the distal tuft. No other fracture. No dislocation. No radiopaque foreign body. the patient does not have any significant past medical history but is a smoker and smokes about a pack of cigarettes a  day. The patient was prescribed Cefadroxil 500 mg twice a day for 7 days and given some dressing changes and asked to see the wound care. the patient was not referred to hand a Engineer, petroleum. Wound History Patient presents with 1 open wound that has been present for approximately 10 days. Patient has been treating wound in the following manner: dry bandage. Laboratory tests have not been performed in the last month. Patient reportedly has not tested positive for an antibiotic resistant organism. Patient reportedly has not tested positive for osteomyelitis. Patient reportedly has not had testing performed to evaluate circulation in the legs. Patient History Information obtained from Patient. Allergies No Known Allergies Family History No family history of Cancer, Diabetes, Heart Disease, Hereditary Spherocytosis, Hypertension, Kidney Disease, Lung Disease, Seizures, Stroke, Thyroid Problems, Tuberculosis. Social History DERYL, GIROUX ([id number]) Current every day smoker - 1/2 pack to 1 pack, Marital Status - Single, Alcohol Use - Rarely, Drug Use - Current History - marijana occasionally, Caffeine Use - Daily. Medical History Hematologic/Lymphatic Denies history of Anemia, Hemophilia, Human Immunodeficiency Virus, Lymphedema, Sickle Cell Disease Respiratory Denies history of Aspiration, Asthma, Chronic Obstructive Pulmonary Disease (COPD), Pneumothorax, Sleep Apnea, Tuberculosis Cardiovascular Denies history of Angina, Arrhythmia, Congestive Heart Failure, Coronary Artery Disease, Deep Vein Thrombosis, Hypertension, Hypotension, Myocardial Infarction, Peripheral Arterial Disease, Peripheral Venous Disease, Phlebitis, Vasculitis Gastrointestinal Denies history of Cirrhosis , Colitis, Crohn s, Hepatitis A, Hepatitis B, Hepatitis C Endocrine Denies history of Type I Diabetes, Type II Diabetes Genitourinary Denies history of End Stage Renal Disease Immunological Denies history  of Lupus Erythematosus, Raynaud s, Scleroderma Integumentary (Skin) Denies history of History of Burn, History of pressure wounds Musculoskeletal Denies history of Gout, Rheumatoid Arthritis, Osteoarthritis, Osteomyelitis Neurologic Denies history of Dementia, Neuropathy, Quadriplegia, Paraplegia, Seizure Disorder Oncologic Denies history of Received Chemotherapy, Received Radiation Psychiatric Denies history of Anorexia/bulimia, Confinement Anxiety Review of Systems (ROS) Constitutional Symptoms (General Health) The patient has no complaints or symptoms. Eyes Complains or has symptoms of Glasses / Contacts - glasses. Ear/Nose/Mouth/Throat The patient has no complaints or symptoms. Hematologic/Lymphatic The patient has no complaints or symptoms. Respiratory The patient has no complaints or symptoms. Cardiovascular The patient has no complaints or symptoms. Gastrointestinal Denies complaints or symptoms of Frequent diarrhea, Nausea, Vomiting. Endocrine Denies complaints or symptoms of Hepatitis, Thyroid disease, Polydypsia (Excessive Thirst). Genitourinary The patient has no complaints or symptoms. Immunological Denies complaints or symptoms of Hives, Itching. Integumentary (Skin) Complains or has symptoms of Wounds. Denies complaints or symptoms  of Bleeding or bruising tendency, Breakdown, Swelling. Musculoskeletal Denies complaints or symptoms of Muscle Pain, Muscle Weakness. Neurologic Denies complaints or symptoms of Numbness/parasthesias, Focal/Weakness. MARQUIE, ADERHOLD ([id number]) Oncologic The patient has no complaints or symptoms. Psychiatric Denies complaints or symptoms of Anxiety, Claustrophobia. Medications : he has completed his courses of antibiotics and occasionally takes the tramadol. Does not have any regular medications Objective Constitutional Pulse regular. Respirations normal and unlabored. Afebrile. Vitals Time Taken: 8:38 AM, Height: 76 in,  Source: Measured, Weight: 235 lbs, Source: Measured, BMI: 28.6, Temperature: 98.2 F, Pulse: 68 bpm, Respiratory Rate: 18 breaths/min, Blood Pressure: 160/90 mmHg. Eyes Nonicteric. Reactive to light. Ears, Nose, Mouth, and Throat Lips, teeth, and gums WNL.Marland Kitchen Moist mucosa without lesions. Neck supple and nontender. No palpable supraclavicular or cervical adenopathy. Normal sized without goiter. Respiratory WNL. No retractions.. Cardiovascular Pedal Pulses WNL. No clubbing, cyanosis or edema. Gastrointestinal (GI) Abdomen without masses or tenderness.. No liver or spleen enlargement or tenderness.. Lymphatic No adneopathy. No adenopathy. No adenopathy. Musculoskeletal Adexa without tenderness or enlargement.. Digits and nails w/o clubbing, cyanosis, infection, petechiae, ischemia, or inflammatory conditions.Marland Kitchen Psychiatric Judgement and insight Intact.. No evidence of depression, anxiety, or agitation.. General Notes: the patient has a degloving injury of the distal phalanx of the left middle finger and has no nail. No tendons or bone seen or palpated. He does not have any evidence of restricted range of movement except for the distal phalanx. No debridement required. Integumentary (Hair, Skin) Loseke, Kha K. ([id number]) No suspicious lesions. No crepitus or fluctuance. No peri-wound warmth or erythema. No masses.. Wound #1 status is Open. Original cause of wound was Trauma. The wound is located on the Left Hand - 3rd Digit. The wound measures 3cm length x 1.5cm width x 0.1cm depth; 3.534cm^2 area and 0.353cm^3 volume. There is Fat Layer (Subcutaneous Tissue) Exposed exposed. There is no tunneling or undermining noted. There is a large amount of serous drainage noted. The wound margin is flat and intact. There is large (67-100%) red granulation within the wound bed. There is a small (1-33%) amount of necrotic tissue within the wound bed including Adherent Slough. The periwound skin  appearance exhibited: Maceration. The periwound skin appearance did not exhibit: Callus, Crepitus, Excoriation, Induration, Rash, Scarring, Dry/Scaly, Atrophie Blanche, Cyanosis, Ecchymosis, Hemosiderin Staining, Mottled, Pallor, Rubor, Erythema. Periwound temperature was noted as No Abnormality. The periwound has tenderness on palpation. Assessment Active Problems ICD-10 S61.303A - Unspecified open wound of left middle finger with damage to nail, initial encounter S62.663B - Nondisplaced fracture of distal phalanx of left middle finger, initial encounter for open fracture F17.218 - Nicotine dependence, cigarettes, with other nicotine-induced disorders this 46 year old gentleman with a degloving injury to the left middle finger has a subtle fracture which is nondisplaced. After review of his wound I have recommended: 1. Silver alginate and a light dressing over the distal phalanx to be changed daily or every other day 2. Hand surgeon to review for possible closure of the wound with a full-thickness skin graft. I believe this will help him in the healing of this wound which is otherwise going to have a prolonged recovery time 3. Continue with mobility of his hand and discussed appropriate methods to keep the wound dry during the day 4. Adequate protein, vitamin A, vitamin C and zinc 5. Have spent 3 minutes discussing with him the need to completely give up smoking and I discussed the risks benefits alternatives and all the possible complications of not doing so. 6. Regular visits  to the wound center and in his care has been taken over by the hand surgeon Plan Wound Cleansing: Wound #1 Left Hand - 3rd Digit: Clean wound with Normal Saline. May Shower, gently pat wound dry prior to applying new dressing. Primary Wound Dressing: Wound #1 Left Hand - 3rd Digit: Silvercel Non-Adherent Secondary Dressing: Wound #1 Left Hand - 3rd Digit: Gauze and Kerlix/Conform Dressing Change  Frequency: Wound #1 Left Hand - 3rd Digit: Change dressing every day. Follow-up Appointments: Wound #1 Left Hand - 3rd Digit: HRISHIKESH, HOEG. ([id number]) Return Appointment in 1 week. Additional Orders / Instructions: Wound #1 Left Hand - 3rd Digit: Stop Smoking Vitamin A; Vitamin C, Zinc - over the counter supplements Increase protein intake. Consults ordered were: General Surgery - Referral to [facility] this 46 year old gentleman with a degloving injury to the left middle finger has a subtle fracture which is nondisplaced. After review of his wound I have recommended: 1. Silver alginate and a light dressing over the distal phalanx to be changed daily or every other day 2. Hand surgeon to review for possible closure of the wound with a full-thickness skin graft. I believe this will help him in the healing of this wound which is otherwise going to have a prolonged recovery time 3. Continue with mobility of his hand and discussed appropriate methods to keep the wound dry during the day 4. Adequate protein, vitamin A, vitamin C and zinc 5. Have spent 3 minutes discussing with him the need to completely give up smoking and I discussed the risks benefits alternatives and all the possible complications of not doing so. 6. Regular visits to the wound center and in his care has been taken over by the hand surgeon Electronic Signature(s) Signed: 06/02/2017 9:49:37 AM By: Evlyn Kanner MD, FACS Entered By: Evlyn Kanner on 06/02/2017 09:49:37 Hunter Clarks ([id number]) -------------------------------------------------------------------------------- ROS/PFSH Details Patient Name: Hunter Clarks. Date of Service: 06/02/2017 9:00 AM Medical Record Number: [id number] Patient Account Number: [id number] Date of Birth/Sex: 25-Dec-1970 (46 y.o. Male) Treating RN: Curtis Sites Primary Care Provider: PATIENT, NO Other Clinician: Referring Provider: Janalyn Harder Treating  Provider/Extender: Rudene Re in Treatment: 0 Information Obtained From Patient Wound History Do you currently have one or more open woundso Yes How many open wounds do you currently haveo 1 Approximately how long have you had your woundso 10 days How have you been treating your wound(s) until nowo dry bandage Has your wound(s) ever healed and then re-openedo No Have you had any lab work done in the past montho No Have you tested positive for an antibiotic resistant organism (MRSA, VRE)o No Have you tested positive for osteomyelitis (bone infection)o No Have you had any tests for circulation on your legso No Eyes Complaints and Symptoms: Positive for: Glasses / Contacts - glasses Gastrointestinal Complaints and Symptoms: Negative for: Frequent diarrhea; Nausea; Vomiting Medical History: Negative for: Cirrhosis ; Colitis; Crohnos; Hepatitis A; Hepatitis B; Hepatitis C Endocrine Complaints and Symptoms: Negative for: Hepatitis; Thyroid disease; Polydypsia (Excessive Thirst) Medical History: Negative for: Type I Diabetes; Type II Diabetes Genitourinary Complaints and Symptoms: No Complaints or Symptoms Complaints and Symptoms: Negative for: Kidney failure/ Dialysis; Incontinence/dribbling Medical History: Negative for: End Stage Renal Disease Immunological Complaints and Symptoms: Negative for: Hives; Itching STEVENS, MAGWOOD ([id number]) Medical History: Negative for: Lupus Erythematosus; Raynaudos; Scleroderma Integumentary (Skin) Complaints and Symptoms: Positive for: Wounds Negative for: Bleeding or bruising tendency; Breakdown; Swelling Medical History: Negative for: History of Burn; History  of pressure wounds Musculoskeletal Complaints and Symptoms: Negative for: Muscle Pain; Muscle Weakness Medical History: Negative for: Gout; Rheumatoid Arthritis; Osteoarthritis; Osteomyelitis Neurologic Complaints and Symptoms: Negative for: Numbness/parasthesias;  Focal/Weakness Medical History: Negative for: Dementia; Neuropathy; Quadriplegia; Paraplegia; Seizure Disorder Psychiatric Complaints and Symptoms: Negative for: Anxiety; Claustrophobia Medical History: Negative for: Anorexia/bulimia; Confinement Anxiety Constitutional Symptoms (General Health) Complaints and Symptoms: No Complaints or Symptoms Ear/Nose/Mouth/Throat Complaints and Symptoms: No Complaints or Symptoms Hematologic/Lymphatic Complaints and Symptoms: No Complaints or Symptoms Medical History: Negative for: Anemia; Hemophilia; Human Immunodeficiency Virus; Lymphedema; Sickle Cell Disease Respiratory Complaints and Symptoms: No Complaints or Symptoms Medical History: ZAYLYN, BERGDOLL ([id number]) Negative for: Aspiration; Asthma; Chronic Obstructive Pulmonary Disease (COPD); Pneumothorax; Sleep Apnea; Tuberculosis Cardiovascular Complaints and Symptoms: No Complaints or Symptoms Medical History: Negative for: Angina; Arrhythmia; Congestive Heart Failure; Coronary Artery Disease; Deep Vein Thrombosis; Hypertension; Hypotension; Myocardial Infarction; Peripheral Arterial Disease; Peripheral Venous Disease; Phlebitis; Vasculitis Oncologic Complaints and Symptoms: No Complaints or Symptoms Medical History: Negative for: Received Chemotherapy; Received Radiation Immunizations Pneumococcal Vaccine: Received Pneumococcal Vaccination: Yes Tetanus Vaccine: Last tetanus shot: 05/23/2017 Implantable Devices Family and Social History Cancer: No; Diabetes: No; Heart Disease: No; Hereditary Spherocytosis: No; Hypertension: No; Kidney Disease: No; Lung Disease: No; Seizures: No; Stroke: No; Thyroid Problems: No; Tuberculosis: No; Current every day smoker - 1/2 pack to 1 pack; Marital Status - Single; Alcohol Use: Rarely; Drug Use: Current History - marijana occasionally; Caffeine Use: Daily; Financial Concerns: No; Food, Clothing or Shelter Needs: No; Support System  Lacking: No; Transportation Concerns: No; Advanced Directives: No; Patient does not want information on Advanced Directives; Do not resuscitate: No; Living Will: No; Medical Power of Attorney: No Physician Affirmation I have reviewed and agree with the above information. Electronic Signature(s) Signed: 06/02/2017 1:42:42 PM By: Evlyn Kanner MD, FACS Signed: 06/02/2017 4:57:04 PM By: Curtis Sites Entered By: Evlyn Kanner on 06/02/2017 09:43:54 Hunter Clarks ([id number]) -------------------------------------------------------------------------------- SuperBill Details Patient Name: Hunter Clarks Date of Service: 06/02/2017 Medical Record Number: [id number] Patient Account Number: [id number] Date of Birth/Sex: 09-02-1970 (46 y.o. Male) Treating RN: Curtis Sites Primary Care Provider: PATIENT, NO Other Clinician: Referring Provider: Janalyn Harder Treating Provider/Extender: Rudene Re in Treatment: 0 Diagnosis Coding ICD-10 Codes Code Description [phone] Unspecified open wound of left middle finger with damage to nail, initial encounter S62.663B Nondisplaced fracture of distal phalanx of left middle finger, initial encounter for open fracture F17.218 Nicotine dependence, cigarettes, with other nicotine-induced disorders Facility Procedures CPT4: Description Modifier Quantity Code [id number] 99214 - WOUND CARE VISIT-LEV 4 EST PT 1 CPT4: [id number] 99406-SMOKING CESSATION 3-10MINS 1 ICD-10 Diagnosis Description S61.303A Unspecified open wound of left middle finger with damage to nail, initial encounter S62.663B Nondisplaced fracture of distal phalanx of left middle finger, initial  encounter for open fracture F17.218 Nicotine dependence, cigarettes, with other nicotine-induced disorders Physician Procedures CPT4: Description Modifier Quantity Code [id number] 99204 - WC PHYS LEVEL 4 - NEW PT 25 1 ICD-10 Diagnosis Description S61.303A Unspecified open wound of left middle  finger with damage to nail, initial encounter S62.663B Nondisplaced fracture of distal  phalanx of left middle finger, initial encounter for open fracture F17.218 Nicotine dependence, cigarettes, with other nicotine-induced disorders CPT4: 99406 99406- SMOKING CESSATION 3-10 MINS 1 ICD-10 Diagnosis Description S61.303A Unspecified open wound of left middle finger with damage to nail, initial encounter S62.663B Nondisplaced fracture of distal phalanx of left middle finger, initial  encounter for open fracture F17.218 Nicotine dependence, cigarettes, with other nicotine-induced disorders Electronic Signature(s) Signed: 06/02/2017 10:40:33 AM  By: Curtis Sitesorthy, Joanna Signed: 06/02/2017 1:42:42 PM By: Evlyn KannerBritto, Tona Qualley MD, FACS Previous Signature: 06/02/2017 9:50:01 AM Version By: Evlyn KannerBritto, Shalandria Elsbernd MD, FACS [name], Mando K. ([id number]) Entered By: Curtis Sitesorthy, Joanna on 06/02/2017 10:40:33

## 2017-06-12 ENCOUNTER — Encounter: Payer: Worker's Compensation | Attending: Physician Assistant | Admitting: Physician Assistant

## 2017-06-12 DIAGNOSIS — X58XXXA Exposure to other specified factors, initial encounter: Secondary | ICD-10-CM | POA: Diagnosis not present

## 2017-06-12 DIAGNOSIS — F17218 Nicotine dependence, cigarettes, with other nicotine-induced disorders: Secondary | ICD-10-CM | POA: Insufficient documentation

## 2017-06-12 DIAGNOSIS — S62663B Nondisplaced fracture of distal phalanx of left middle finger, initial encounter for open fracture: Secondary | ICD-10-CM | POA: Diagnosis not present

## 2017-06-13 NOTE — Progress Notes (Signed)
RINO, HOSEA ([id number]) Visit Report for 06/12/2017 Arrival Information Details Patient Name: [name], [name]. Date of Service: 06/12/2017 10:15 AM Medical Record Number: [id number] Patient Account Number: [id number] Date of Birth/Sex: March 26, 1971 (47 y.o. Male) Treating RN: Curtis Sites Primary Care Salathiel Ferrara: PATIENT, NO Other Clinician: Referring Etta Gassett: Janalyn Harder Treating Rashed Edler/Extender: Linwood Dibbles, HOYT Weeks in Treatment: 1 Visit Information History Since Last Visit Added or deleted any medications: No Patient Arrived: Ambulatory Any new allergies or adverse reactions: No Arrival Time: 10:15 Had a fall or experienced change in No Accompanied By: self activities of daily living that may affect Transfer Assistance: None risk of falls: Patient Identification Verified: Yes Signs or symptoms of abuse/neglect since last visito No Secondary Verification Process Completed: Yes Hospitalized since last visit: No Has Dressing in Place as Prescribed: Yes Pain Present Now: No Electronic Signature(s) Signed: 06/12/2017 5:08:30 PM By: Curtis Sites Entered By: Curtis Sites on 06/12/2017 10:15:23 [name] ([id number]) -------------------------------------------------------------------------------- Clinic Level of Care Assessment Details Patient Name: [name] Date of Service: 06/12/2017 10:15 AM Medical Record Number: [id number] Patient Account Number: [id number] Date of Birth/Sex: 1971-01-16 (47 y.o. Male) Treating RN: Curtis Sites Primary Care Hartman Minahan: PATIENT, NO Other Clinician: Referring Luismanuel Corman: Janalyn Harder Treating Bohdan Macho/Extender: Linwood Dibbles, HOYT Weeks in Treatment: 1 Clinic Level of Care Assessment Items TOOL 4 Quantity Score []  - Use when only an EandM is performed on FOLLOW-UP visit 0 ASSESSMENTS - Nursing Assessment / Reassessment X - Reassessment of Co-morbidities (includes updates in patient status) 1 10 X- 1 5 Reassessment of  Adherence to Treatment Plan ASSESSMENTS - Wound and Skin Assessment / Reassessment X - Simple Wound Assessment / Reassessment - one wound 1 5 []  - 0 Complex Wound Assessment / Reassessment - multiple wounds []  - 0 Dermatologic / Skin Assessment (not related to wound area) ASSESSMENTS - Focused Assessment []  - Circumferential Edema Measurements - multi extremities 0 []  - 0 Nutritional Assessment / Counseling / Intervention []  - 0 Lower Extremity Assessment (monofilament, tuning fork, pulses) []  - 0 Peripheral Arterial Disease Assessment (using hand held doppler) ASSESSMENTS - Ostomy and/or Continence Assessment and Care []  - Incontinence Assessment and Management 0 []  - 0 Ostomy Care Assessment and Management (repouching, etc.) PROCESS - Coordination of Care X - Simple Patient / Family Education for ongoing care 1 15 []  - 0 Complex (extensive) Patient / Family Education for ongoing care []  - 0 Staff obtains Chiropractor, Records, Test Results / Process Orders []  - 0 Staff telephones HHA, Nursing Homes / Clarify orders / etc []  - 0 Routine Transfer to another Facility (non-emergent condition) []  - 0 Routine Hospital Admission (non-emergent condition) []  - 0 New Admissions / Manufacturing engineer / Ordering NPWT, Apligraf, etc. []  - 0 Emergency Hospital Admission (emergent condition) X- 1 10 Simple Discharge Coordination [name], FRETT. ([id number]) []  - 0 Complex (extensive) Discharge Coordination PROCESS - Special Needs []  - Pediatric / Minor Patient Management 0 []  - 0 Isolation Patient Management []  - 0 Hearing / Language / Visual special needs []  - 0 Assessment of Community assistance (transportation, D/C planning, etc.) []  - 0 Additional assistance / Altered mentation []  - 0 Support Surface(s) Assessment (bed, cushion, seat, etc.) INTERVENTIONS - Wound Cleansing / Measurement X - Simple Wound Cleansing - one wound 1 5 []  - 0 Complex Wound Cleansing - multiple  wounds X- 1 5 Wound Imaging (photographs - any number of wounds) []  - 0 Wound Tracing (instead of photographs) X- 1 5 Simple Wound  Measurement - one wound []  - 0 Complex Wound Measurement - multiple wounds INTERVENTIONS - Wound Dressings X - Small Wound Dressing one or multiple wounds 1 10 []  - 0 Medium Wound Dressing one or multiple wounds []  - 0 Large Wound Dressing one or multiple wounds X- 1 5 Application of Medications - topical []  - 0 Application of Medications - injection INTERVENTIONS - Miscellaneous []  - External ear exam 0 []  - 0 Specimen Collection (cultures, biopsies, blood, body fluids, etc.) []  - 0 Specimen(s) / Culture(s) sent or taken to Lab for analysis []  - 0 Patient Transfer (multiple staff / Nurse, adult / Similar devices) []  - 0 Simple Staple / Suture removal (25 or less) []  - 0 Complex Staple / Suture removal (26 or more) []  - 0 Hypo / Hyperglycemic Management (close monitor of Blood Glucose) []  - 0 Ankle / Brachial Index (ABI) - do not check if billed separately X- 1 5 Vital Signs [name], Hunter K. ([id number]) Has the patient been seen at the hospital within the last three years: Yes Total Score: 80 Level Of Care: New/Established - Level 3 Electronic Signature(s) Signed: 06/12/2017 5:08:30 PM By: Curtis Sites Entered By: Curtis Sites on 06/12/2017 10:46:23 [name] ([id number]) -------------------------------------------------------------------------------- Encounter Discharge Information Details Patient Name: [name] Date of Service: 06/12/2017 10:15 AM Medical Record Number: [id number] Patient Account Number: [id number] Date of Birth/Sex: May 02, 1971 (47 y.o. Male) Treating RN: Curtis Sites Primary Care Winfred Redel: PATIENT, NO Other Clinician: Referring Alonso Gapinski: Janalyn Harder Treating Jameil Whitmoyer/Extender: Linwood Dibbles, HOYT Weeks in Treatment: 1 Encounter Discharge Information Items Discharge Pain Level: 0 Discharge  Condition: Stable Ambulatory Status: Ambulatory Discharge Destination: Home Transportation: Private Auto Accompanied By: self Schedule Follow-up Appointment: Yes Medication Reconciliation completed and No provided to Patient/Care [facility]: Provided on Clinical Summary of Care: 06/12/2017 Form Type Recipient Paper Patient TS Electronic Signature(s) Signed: 06/12/2017 12:33:27 PM By: Curtis Sites Entered By: Curtis Sites on 06/12/2017 12:33:27 [name] ([id number]) -------------------------------------------------------------------------------- Multi Wound Chart Details Patient Name: [name]. Date of Service: 06/12/2017 10:15 AM Medical Record Number: [id number] Patient Account Number: [id number] Date of Birth/Sex: 03-17-1971 (47 y.o. Male) Treating RN: Curtis Sites Primary Care Trezure Cronk: PATIENT, NO Other Clinician: Referring Caydyn Sprung: Janalyn Harder Treating Amaar Oshita/Extender: STONE III, HOYT Weeks in Treatment: 1 Vital Signs Height(in): 76 Pulse(bpm): 75 Weight(lbs): 235 Blood Pressure(mmHg): 164/82 Body Mass Index(BMI): 29 Temperature(F): 98.3 Respiratory Rate 18 (breaths/min): Photos: [1:No Photos] [N/A:N/A] Wound Location: [1:Left Hand - 3rd Digit] [N/A:N/A] Wounding Event: [1:Trauma] [N/A:N/A] Primary Etiology: [1:Trauma, Other] [N/A:N/A] Date Acquired: [1:05/23/2017] [N/A:N/A] Weeks of Treatment: [1:1] [N/A:N/A] Wound Status: [1:Open] [N/A:N/A] Pending Amputation on [1:Yes] [N/A:N/A] Presentation: Measurements L x W x D [1:2.5x1.5x0.1] [N/A:N/A] (cm) Area (cm) : [1:2.945] [N/A:N/A] Volume (cm) : [1:0.295] [N/A:N/A] % Reduction in Area: [1:16.70%] [N/A:N/A] % Reduction in Volume: [1:16.40%] [N/A:N/A] Classification: [1:Full Thickness With Exposed Support Structures] [N/A:N/A] Exudate Amount: [1:Large] [N/A:N/A] Exudate Type: [1:Serous] [N/A:N/A] Exudate Color: [1:amber] [N/A:N/A] Wound Margin: [1:Flat and Intact]  [N/A:N/A] Granulation Amount: [1:Large (67-100%)] [N/A:N/A] Granulation Quality: [1:Red] [N/A:N/A] Necrotic Amount: [1:Small (1-33%)] [N/A:N/A] Exposed Structures: [1:Fat Layer (Subcutaneous Tissue) Exposed: Yes Fascia: No Tendon: No Muscle: No Joint: No Bone: No] [N/A:N/A] Epithelialization: [1:Small (1-33%)] [N/A:N/A] Periwound Skin Texture: [1:Excoriation: No Induration: No Callus: No Crepitus: No] [N/A:N/A] Rash: No Scarring: No Periwound Skin Moisture: Maceration: Yes N/A N/A Dry/Scaly: No Periwound Skin Color: Atrophie Blanche: No N/A N/A Cyanosis: No Ecchymosis: No Erythema: No Hemosiderin Staining: No Mottled: No Pallor: No Rubor: No Temperature: No Abnormality N/A  N/A Tenderness on Palpation: Yes N/A N/A Wound Preparation: Ulcer Cleansing: N/A N/A Rinsed/Irrigated with Saline Topical Anesthetic Applied: Other: lidocaine 4% Treatment Notes Electronic Signature(s) Signed: 06/12/2017 5:08:30 PM By: Curtis Sites Entered By: Curtis Sites on 06/12/2017 10:33:27 [name] ([id number]) -------------------------------------------------------------------------------- Multi-Disciplinary Care Plan Details Patient Name: [name], [name]. Date of Service: 06/12/2017 10:15 AM Medical Record Number: [id number] Patient Account Number: [id number] Date of Birth/Sex: [birth year] (47 y.o. Male) Treating RN: Curtis Sites Primary Care Lashana Spang: PATIENT, NO Other Clinician: Referring Nhia Heaphy: Janalyn Harder Treating Anjali Manzella/Extender: Linwood Dibbles, HOYT Weeks in Treatment: 1 Active Inactive ` Orientation to the Wound Care Program Nursing Diagnoses: Knowledge deficit related to the wound healing center program Goals: Patient/caregiver will verbalize understanding of the Wound Healing Center Program Date Initiated: 06/02/2017 Target Resolution Date: 08/12/2017 Goal Status: Active Interventions: Provide education on orientation to the wound center Notes: ` Wound/Skin  Impairment Nursing Diagnoses: Impaired tissue integrity Goals: Ulcer/skin breakdown will heal within 14 weeks Date Initiated: 06/02/2017 Target Resolution Date: 08/12/2017 Goal Status: Active Interventions: Assess patient/caregiver ability to obtain necessary supplies Assess patient/caregiver ability to perform ulcer/skin care regimen upon admission and as needed Assess ulceration(s) every visit Notes: Electronic Signature(s) Signed: 06/12/2017 5:08:30 PM By: Curtis Sites Entered By: Curtis Sites on 06/12/2017 10:33:19 [name] ([id number]) -------------------------------------------------------------------------------- Pain Assessment Details Patient Name: [name]. Date of Service: 06/12/2017 10:15 AM Medical Record Number: [id number] Patient Account Number: [id number] Date of Birth/Sex: May 20, 1971 (47 y.o. Male) Treating RN: Curtis Sites Primary Care Tarquin Welcher: PATIENT, NO Other Clinician: Referring Violanda Bobeck: Janalyn Harder Treating Zayah Keilman/Extender: Linwood Dibbles, HOYT Weeks in Treatment: 1 Active Problems Location of Pain Severity and Description of Pain Patient Has Paino No Site Locations Pain Management and Medication Current Pain Management: Notes Topical or injectable lidocaine is offered to patient for acute pain when surgical debridement is performed. If needed, Patient is instructed to use over the counter pain medication for the following 24-48 hours after debridement. Wound care MDs do not prescribed pain medications. Patient has chronic pain or uncontrolled pain. Patient has been instructed to make an appointment with their Primary Care Physician for pain management. Electronic Signature(s) Signed: 06/12/2017 5:08:30 PM By: Curtis Sites Entered By: Curtis Sites on 06/12/2017 10:15:40 [name] ([id number]) -------------------------------------------------------------------------------- Patient/Caregiver Education Details Patient Name:  [name] Date of Service: 06/12/2017 10:15 AM Medical Record Number: [id number] Patient Account Number: [id number] Date of Birth/Gender: January 04, 1971 (47 y.o. Male) Treating RN: Curtis Sites Primary Care Physician: PATIENT, NO Other Clinician: Referring Physician: Janalyn Harder Treating Physician/Extender: Skeet Simmer in Treatment: 1 Education Assessment Education Provided To: Patient Education Topics Provided Wound/Skin Impairment: Handouts: Other: wound care as ordered Methods: Demonstration, Explain/Verbal Responses: State content correctly Electronic Signature(s) Signed: 06/12/2017 5:08:30 PM By: Curtis Sites Entered By: Curtis Sites on 06/12/2017 12:33:54 [name] ([id number]) -------------------------------------------------------------------------------- Wound Assessment Details Patient Name: [name]. Date of Service: 06/12/2017 10:15 AM Medical Record Number: [id number] Patient Account Number: [id number] Date of Birth/Sex: 18-Mar-1971 (47 y.o. Male) Treating RN: Curtis Sites Primary Care Sylvia Kondracki: PATIENT, NO Other Clinician: Referring Mattheus Rauls: Janalyn Harder Treating Maleaha Hughett/Extender: STONE III, HOYT Weeks in Treatment: 1 Wound Status Wound Number: 1 Primary Etiology: Trauma, Other Wound Location: Left Hand - 3rd Digit Wound Status: Open Wounding Event: Trauma Date Acquired: 05/23/2017 Weeks Of Treatment: 1 Clustered Wound: No Pending Amputation On Presentation Photos Photo Uploaded By: Curtis Sites on 06/12/2017 11:08:42 Wound Measurements Length: (cm) 2.5 Width: (cm) 1.5 Depth: (cm) 0.1 Area: (cm) 2.945 Volume: (  cm) 0.295 % Reduction in Area: 16.7% % Reduction in Volume: 16.4% Epithelialization: Small (1-33%) Tunneling: No Undermining: No Wound Description Full Thickness With Exposed Support Classification: Structures Wound Margin: Flat and Intact Exudate Large Amount: Exudate Type: Serous Exudate  Color: amber Foul Odor After Cleansing: No Slough/Fibrino Yes Wound Bed Granulation Amount: Large (67-100%) Exposed Structure Granulation Quality: Red Fascia Exposed: No Necrotic Amount: Small (1-33%) Fat Layer (Subcutaneous Tissue) Exposed: Yes Necrotic Quality: Adherent Slough Tendon Exposed: No Muscle Exposed: No Joint Exposed: No Hunter ClarksSTACEY, Hunter K. ([id number]) Bone Exposed: No Periwound Skin Texture Texture Color No Abnormalities Noted: No No Abnormalities Noted: No Callus: No Atrophie Blanche: No Crepitus: No Cyanosis: No Excoriation: No Ecchymosis: No Induration: No Erythema: No Rash: No Hemosiderin Staining: No Scarring: No Mottled: No Pallor: No Moisture Rubor: No No Abnormalities Noted: No Dry / Scaly: No Temperature / Pain Maceration: Yes Temperature: No Abnormality Tenderness on Palpation: Yes Wound Preparation Ulcer Cleansing: Rinsed/Irrigated with Saline Topical Anesthetic Applied: Other: lidocaine 4%, Treatment Notes Wound #1 (Left Hand - 3rd Digit) 1. Cleansed with: Clean wound with Normal Saline 2. Anesthetic Topical Lidocaine 4% cream to wound bed prior to debridement 4. Dressing Applied: Santyl Ointment 5. Secondary Dressing Applied Kerlix/Conform Non-Adherent pad Notes netting Electronic Signature(s) Signed: 06/12/2017 5:08:30 PM By: Curtis Sitesorthy, Joanna Entered By: Curtis Sitesorthy, Joanna on 06/12/2017 10:21:24 Hunter ClarksSTACEY, Hunter K. ([id number]) -------------------------------------------------------------------------------- Vitals Details Patient Name: Hunter ClarksSTACEY, Hunter K. Date of Service: 06/12/2017 10:15 AM Medical Record Number: [id number] Patient Account Number: [id number] Date of Birth/Sex: 02/16/1971 47(46 y.o. Male) Treating RN: Curtis Sitesorthy, Joanna Primary Care Jimesha Rising: PATIENT, NO Other Clinician: Referring Isa Kohlenberg: Janalyn HarderSINGER, SAMANTHA Treating Kolten Ryback/Extender: STONE III, HOYT Weeks in Treatment: 1 Vital Signs Time Taken: 10:15 Temperature (F):  98.3 Height (in): 76 Pulse (bpm): 75 Weight (lbs): 235 Respiratory Rate (breaths/min): 18 Body Mass Index (BMI): 28.6 Blood Pressure (mmHg): 164/82 Reference Range: 80 - 120 mg / dl Electronic Signature(s) Signed: 06/12/2017 5:08:30 PM By: Curtis Sitesorthy, Joanna Entered By: Curtis Sitesorthy, Joanna on 06/12/2017 10:17:57

## 2017-06-13 NOTE — Progress Notes (Signed)
Hunter, Maxwell ([id number]) Visit Report for 06/12/2017 Chief Complaint Document Details Patient Name: [name], [name]. Date of Service: 06/12/2017 10:15 AM Medical Record Number: [id number] Patient Account Number: [id number] Date of Birth/Sex: March 03, 1971 (47 y.o. Male) Treating RN: Curtis Sites Primary Care Provider: PATIENT, NO Other Clinician: Referring Provider: Janalyn Harder Treating Provider/Extender: Linwood Dibbles, Mahreen Schewe Weeks in Treatment: 1 Information Obtained from: Patient Chief Complaint Patient seen for complaints of Non-Healing Wound to the distal phalanx of the left middle finger which he has had since 05/23/2017 Electronic Signature(s) Signed: 06/12/2017 5:30:29 PM By: Lenda Kelp PA-C Entered By: Lenda Kelp on 06/12/2017 10:27:28 [name] ([id number]) -------------------------------------------------------------------------------- HPI Details Patient Name: [name] Date of Service: 06/12/2017 10:15 AM Medical Record Number: [id number] Patient Account Number: [id number] Date of Birth/Sex: November 24, 1970 (47 y.o. Male) Treating RN: Curtis Sites Primary Care Provider: PATIENT, NO Other Clinician: Referring Provider: Janalyn Harder Treating Provider/Extender: Linwood Dibbles, Kamonte Mcmichen Weeks in Treatment: 1 History of Present Illness Location: and left middle finger Quality: Patient reports experiencing a dull pain to affected area(s). Severity: Patient states wound are getting worse. Duration: Patient has had the wound for < 2 weeks prior to presenting for treatment Timing: Pain in wound is Intermittent (comes and goes Context: The wound occurred when the patient got his finger crushed in a grinder while he was at work and has seen the [employer] unit at [employer]. Modifying Factors: Other treatment(s) tried include:x-ray taken and put on an antibiotic Associated Signs and Symptoms: Patient reports having increase swelling. HPI Description: this  47 year old young man was referred by the [employer] team at [facility] employee health and wellness at [facility] and has a injury to the left middle finger distal phalanx on 05/23/2017. He sustained a crush injury in a grinder and the distal tip of the left middle finger was ripped and torn off including the fingernail. He had an x-ray of the left hand IMPRESSION:1. Middle finger distal soft tissue laceration with loss of soft tissue to the finger tip. Subtle evidence of minimal fracturing of the distal margin of the distal tuft. No other fracture. No dislocation. No radiopaque foreign body. the patient does not have any significant past medical history but is a smoker and smokes about a pack of cigarettes a day. The patient was prescribed Cefadroxil 500 mg twice a day for 7 days and given some dressing changes and asked to see the wound care. the patient was not referred to hand a Engineer, petroleum. 06/12/17 on evaluation today patient appears to be doing decently well in regard to the injury only distal portion of his left middle finger. He is having some discomfort unfortunately. Nonetheless he has not seen the orthopedic specialist as of yet that we referred him to. He states he really wasn't sure if that was something that was necessary or not and subsequently he also states that he has had a difficult time getting in touch with the case manager and adjuster on his case over the holiday. Nonetheless he has continued to perform the dressing changes appropriately and the wound does seem to be showing signs of improvement which is excellent news. Upon review of the x-ray it does appear that the fracture of the tuft was minimal nonetheless I do think she needs orthopedic specialist to monitor this as far as ensuring complete healing and obviously rate and releasing the patient once everything is said and done. Electronic Signature(s) Signed: 06/12/2017 5:30:29 PM By: Larina Bras  III, Ryland Smoots PA-C Entered By: Lenda Kelp on 06/12/2017 11:09:32 [name] ([id number]) -------------------------------------------------------------------------------- Physical Exam Details Patient Name: Maxwell, Hunter. Date of Service: 06/12/2017 10:15 AM Medical Record Number: [id number] Patient Account Number: [id number] Date of Birth/Sex: 12-29-1970 (47 y.o. Male) Treating RN: Curtis Sites Primary Care Provider: PATIENT, NO Other Clinician: Referring Provider: Janalyn Harder Treating Provider/Extender: STONE III, Glendon Fiser Weeks in Treatment: 1 Constitutional Well-nourished and well-hydrated in no acute distress. Respiratory normal breathing without difficulty. clear to auscultation bilaterally. Cardiovascular regular rate and rhythm with normal S1, S2. Psychiatric this patient is able to make decisions and demonstrates good insight into disease process. Alert and Oriented x 3. pleasant and cooperative. Notes Patient's wound shows some Slough covering the tip mainly that was still adherent although not terribly so. I was able to clean some of this off but due to the discomfort no sharp debridement was performed at this time. Patient's wound does seem to be healing nicely. Electronic Signature(s) Signed: 06/12/2017 5:30:29 PM By: Lenda Kelp PA-C Entered By: Lenda Kelp on 06/12/2017 11:10:19 [name] ([id number]) -------------------------------------------------------------------------------- Physician Orders Details Patient Name: [name] Date of Service: 06/12/2017 10:15 AM Medical Record Number: [id number] Patient Account Number: [id number] Date of Birth/Sex: 04-21-1971 (47 y.o. Male) Treating RN: Curtis Sites Primary Care Provider: PATIENT, NO Other Clinician: Referring Provider: Janalyn Harder Treating Provider/Extender: Linwood Dibbles, Thatiana Renbarger Weeks in Treatment: 1 Verbal / Phone Orders: No Diagnosis Coding ICD-10 Coding Code  Description S61.303A Unspecified open wound of left middle finger with damage to nail, initial encounter S62.663B Nondisplaced fracture of distal phalanx of left middle finger, initial encounter for open fracture F17.218 Nicotine dependence, cigarettes, with other nicotine-induced disorders Wound Cleansing Wound #1 Left Hand - 3rd Digit o Clean wound with Normal Saline. o May Shower, gently pat wound dry prior to applying new dressing. Primary Wound Dressing Wound #1 Left Hand - 3rd Digit o Santyl Ointment Secondary Dressing Wound #1 Left Hand - 3rd Digit o Gauze and Kerlix/Conform Dressing Change Frequency Wound #1 Left Hand - 3rd Digit o Change dressing every day. Follow-up Appointments Wound #1 Left Hand - 3rd Digit o Return Appointment in 1 week. Additional Orders / Instructions Wound #1 Left Hand - 3rd Digit o Stop Smoking o Vitamin A; Vitamin C, Zinc - over the counter supplements o Increase protein intake. Patient Medications Allergies: No Known Allergies Notifications Medication Indication Start End Santyl 06/12/2017 DOSE topical 250 unit/gram ointment - ointment topical applied nickel thick daily and then cover with a dry dressing. Cleanse with each dressing change ROMELLE, REILEY ([id number]) Electronic Signature(s) Signed: 06/12/2017 11:11:44 AM By: Lenda Kelp PA-C Entered By: Lenda Kelp on 06/12/2017 11:11:44 [name] ([id number]) -------------------------------------------------------------------------------- Problem List Details Patient Name: ASA, BAUDOIN. Date of Service: 06/12/2017 10:15 AM Medical Record Number: [id number] Patient Account Number: [id number] Date of Birth/Sex: [birth year] (47 y.o. Male) Treating RN: Curtis Sites Primary Care Provider: PATIENT, NO Other Clinician: Referring Provider: Janalyn Harder Treating Provider/Extender: Linwood Dibbles, Latima Hamza Weeks in Treatment: 1 Active Problems ICD-10 Encounter Code  Description Active Date Diagnosis S61.303A Unspecified open wound of left middle finger with damage to nail, 06/02/2017 Yes initial encounter S62.663B Nondisplaced fracture of distal phalanx of left middle finger, initial 06/02/2017 Yes encounter for open fracture F17.218 Nicotine dependence, cigarettes, with other nicotine-induced 06/02/2017 Yes disorders Inactive Problems Resolved Problems Electronic Signature(s) Signed: 06/12/2017 5:30:29 PM By: Lenda Kelp PA-C Entered By: Lenda Kelp on 06/12/2017 10:27:09 Sadie Haber  Kirtland Bouchard ([id number]) -------------------------------------------------------------------------------- Progress Note Details Patient Name: DUANE, EARNSHAW. Date of Service: 06/12/2017 10:15 AM Medical Record Number: [id number] Patient Account Number: [id number] Date of Birth/Sex: 04/11/1971 (47 y.o. Male) Treating RN: Curtis Sites Primary Care Provider: PATIENT, NO Other Clinician: Referring Provider: Janalyn Harder Treating Provider/Extender: Linwood Dibbles, Ivey Nembhard Weeks in Treatment: 1 Subjective Chief Complaint Information obtained from Patient Patient seen for complaints of Non-Healing Wound to the distal phalanx of the left middle finger which he has had since 05/23/2017 History of Present Illness (HPI) The following HPI elements were documented for the patient's wound: Location: and left middle finger Quality: Patient reports experiencing a dull pain to affected area(s). Severity: Patient states wound are getting worse. Duration: Patient has had the wound for < 2 weeks prior to presenting for treatment Timing: Pain in wound is Intermittent (comes and goes Context: The wound occurred when the patient got his finger crushed in a grinder while he was at work and has seen the [employer] unit at [employer]. Modifying Factors: Other treatment(s) tried include:x-ray taken and put on an antibiotic Associated Signs and Symptoms: Patient reports having  increase swelling. this 47 year old young man was referred by the [employer] team at [facility] employee health and wellness at [facility] and has a injury to the left middle finger distal phalanx on 05/23/2017. He sustained a crush injury in a grinder and the distal tip of the left middle finger was ripped and torn off including the fingernail. He had an x-ray of the left hand IMPRESSION:1. Middle finger distal soft tissue laceration with loss of soft tissue to the finger tip. Subtle evidence of minimal fracturing of the distal margin of the distal tuft. No other fracture. No dislocation. No radiopaque foreign body. the patient does not have any significant past medical history but is a smoker and smokes about a pack of cigarettes a day. The patient was prescribed Cefadroxil 500 mg twice a day for 7 days and given some dressing changes and asked to see the wound care. the patient was not referred to hand a Engineer, petroleum. 06/12/17 on evaluation today patient appears to be doing decently well in regard to the injury only distal portion of his left middle finger. He is having some discomfort unfortunately. Nonetheless he has not seen the orthopedic specialist as of yet that we referred him to. He states he really wasn't sure if that was something that was necessary or not and subsequently he also states that he has had a difficult time getting in touch with the case manager and adjuster on his case over the holiday. Nonetheless he has continued to perform the dressing changes appropriately and the wound does seem to be showing signs of improvement which is excellent news. Upon review of the x-ray it does appear that the fracture of the tuft was minimal nonetheless I do think she needs orthopedic specialist to monitor this as far as ensuring complete healing and obviously rate and releasing the patient once everything is said and done. Patient History Information obtained from  Patient. Family History No family history of Cancer, Diabetes, Heart Disease, Hereditary Spherocytosis, Hypertension, Kidney Disease, Lung Disease, Seizures, Stroke, Thyroid Problems, Tuberculosis. Social History Current every day smoker - 1/2 pack to 1 pack, Marital Status - Single, Alcohol Use - Rarely, Drug Use - Current History LOIC, HOBIN. ([id number]) marijana occasionally, Caffeine Use - Daily. Review of Systems (ROS) Constitutional Symptoms (General Health) Denies complaints or symptoms of Fever, Chills. Respiratory  The patient has no complaints or symptoms. Cardiovascular The patient has no complaints or symptoms. Psychiatric The patient has no complaints or symptoms. Objective Constitutional Well-nourished and well-hydrated in no acute distress. Vitals Time Taken: 10:15 AM, Height: 76 in, Weight: 235 lbs, BMI: 28.6, Temperature: 98.3 F, Pulse: 75 bpm, Respiratory Rate: 18 breaths/min, Blood Pressure: 164/82 mmHg. Respiratory normal breathing without difficulty. clear to auscultation bilaterally. Cardiovascular regular rate and rhythm with normal S1, S2. Psychiatric this patient is able to make decisions and demonstrates good insight into disease process. Alert and Oriented x 3. pleasant and cooperative. General Notes: Patient's wound shows some Slough covering the tip mainly that was still adherent although not terribly so. I was able to clean some of this off but due to the discomfort no sharp debridement was performed at this time. Patient's wound does seem to be healing nicely. Integumentary (Hair, Skin) Wound #1 status is Open. Original cause of wound was Trauma. The wound is located on the Left Hand - 3rd Digit. The wound measures 2.5cm length x 1.5cm width x 0.1cm depth; 2.945cm^2 area and 0.295cm^3 volume. There is Fat Layer (Subcutaneous Tissue) Exposed exposed. There is no tunneling or undermining noted. There is a large amount of serous drainage noted.  The wound margin is flat and intact. There is large (67-100%) red granulation within the wound bed. There is a small (1-33%) amount of necrotic tissue within the wound bed including Adherent Slough. The periwound skin appearance exhibited: Maceration. The periwound skin appearance did not exhibit: Callus, Crepitus, Excoriation, Induration, Rash, Scarring, Dry/Scaly, Atrophie Blanche, Cyanosis, Ecchymosis, Hemosiderin Staining, Mottled, Pallor, Rubor, Erythema. Periwound temperature was noted as No Abnormality. The periwound has tenderness on palpation. VARIAN, INNES ([id number]) Assessment Active Problems ICD-10 [phone] - Unspecified open wound of left middle finger with damage to nail, initial encounter S62.663B - Nondisplaced fracture of distal phalanx of left middle finger, initial encounter for open fracture F17.218 - Nicotine dependence, cigarettes, with other nicotine-induced disorders Plan Wound Cleansing: Wound #1 Left Hand - 3rd Digit: Clean wound with Normal Saline. May Shower, gently pat wound dry prior to applying new dressing. Primary Wound Dressing: Wound #1 Left Hand - 3rd Digit: Santyl Ointment Secondary Dressing: Wound #1 Left Hand - 3rd Digit: Gauze and Kerlix/Conform Dressing Change Frequency: Wound #1 Left Hand - 3rd Digit: Change dressing every day. Follow-up Appointments: Wound #1 Left Hand - 3rd Digit: Return Appointment in 1 week. Additional Orders / Instructions: Wound #1 Left Hand - 3rd Digit: Stop Smoking Vitamin A; Vitamin C, Zinc - over the counter supplements Increase protein intake. The following medication(s) was prescribed: Santyl topical 250 unit/gram ointment ointment topical applied nickel thick daily and then cover with a dry dressing. Cleanse with each dressing change starting 06/12/2017 I am going to recommend that we go ahead and send in a prescription for Santyl to try to help with cleaning up the wound bed since I was not able to  sharply debride the slough off today. Patient is in agreement with this plan. We will otherwise continue with the Current wound care measures fortunately the wound does not appear to be draining to significantly. Please see above for specific wound care orders. We will see patient for re-evaluation in 1 week(s) here in the clinic. If anything worsens or changes patient will contact our office for additional recommendations. Electronic Signature(s) Signed: 06/12/2017 5:30:29 PM By: Lenda Kelp PA-C Entered By: Lenda Kelp on 06/12/2017 11:11:57 Christianson, Vincent Peyer ([id number]) Pietsch, Shaya K. (  [id number]) -------------------------------------------------------------------------------- ROS/PFSH Details Patient Name: Hunter ClarksSTACEY, Maricus K. Date of Service: 06/12/2017 10:15 AM Medical Record Number: [id number] Patient Account Number: [id number] Date of Birth/Sex: 05/05/1971 [age] y.o. Male) Treating RN: Curtis Sitesorthy, Joanna Primary Care Provider: PATIENT, NO Other Clinician: Referring Provider: Janalyn HarderSINGER, SAMANTHA Treating Provider/Extender: Linwood DibblesSTONE III, Vivianna Piccini Weeks in Treatment: 1 Information Obtained From Patient Wound History Do you currently have one or more open woundso Yes How many open wounds do you currently haveo 1 Approximately how long have you had your woundso 10 days How have you been treating your wound(s) until nowo dry bandage Has your wound(s) ever healed and then re-openedo No Have you had any lab work done in the past montho No Have you tested positive for an antibiotic resistant organism (MRSA, VRE)o No Have you tested positive for osteomyelitis (bone infection)o No Have you had any tests for circulation on your legso No Constitutional Symptoms (General Health) Complaints and Symptoms: Negative for: Fever; Chills Hematologic/Lymphatic Medical History: Negative for: Anemia; Hemophilia; Human Immunodeficiency Virus; Lymphedema; Sickle Cell Disease Respiratory Complaints and  Symptoms: No Complaints or Symptoms Medical History: Negative for: Aspiration; Asthma; Chronic Obstructive Pulmonary Disease (COPD); Pneumothorax; Sleep Apnea; Tuberculosis Cardiovascular Complaints and Symptoms: No Complaints or Symptoms Medical History: Negative for: Angina; Arrhythmia; Congestive Heart Failure; Coronary Artery Disease; Deep Vein Thrombosis; Hypertension; Hypotension; Myocardial Infarction; Peripheral Arterial Disease; Peripheral Venous Disease; Phlebitis; Vasculitis Gastrointestinal Medical History: Negative for: Cirrhosis ; Colitis; Crohnos; Hepatitis A; Hepatitis B; Hepatitis C Endocrine Hunter ClarksSTACEY, Petra K. ([id number]) Medical History: Negative for: Type I Diabetes; Type II Diabetes Genitourinary Medical History: Negative for: End Stage Renal Disease Immunological Medical History: Negative for: Lupus Erythematosus; Raynaudos; Scleroderma Integumentary (Skin) Medical History: Negative for: History of Burn; History of pressure wounds Musculoskeletal Medical History: Negative for: Gout; Rheumatoid Arthritis; Osteoarthritis; Osteomyelitis Neurologic Medical History: Negative for: Dementia; Neuropathy; Quadriplegia; Paraplegia; Seizure Disorder Oncologic Medical History: Negative for: Received Chemotherapy; Received Radiation Psychiatric Complaints and Symptoms: No Complaints or Symptoms Medical History: Negative for: Anorexia/bulimia; Confinement Anxiety Immunizations Pneumococcal Vaccine: Received Pneumococcal Vaccination: Yes Tetanus Vaccine: Last tetanus shot: 05/23/2017 Implantable Devices Family and Social History Cancer: No; Diabetes: No; Heart Disease: No; Hereditary Spherocytosis: No; Hypertension: No; Kidney Disease: No; Lung Disease: No; Seizures: No; Stroke: No; Thyroid Problems: No; Tuberculosis: No; Current every day smoker - 1/2 pack to 1 pack; Marital Status - Single; Alcohol Use: Rarely; Drug Use: Current History - marijana  occasionally; Caffeine Use: Daily; Financial Concerns: No; Food, Clothing or Shelter Needs: No; Support System Lacking: No; Transportation Concerns: No; Advanced Directives: No; Patient does not want information on Advanced Directives; Do not resuscitate: No; Living Will: No; Medical Power of Attorney: No Physician Affirmation I have reviewed and agree with the above information. Hunter ClarksSTACEY, Parmvir K. ([id number]) Electronic Signature(s) Signed: 06/12/2017 5:08:30 PM By: Curtis Sitesorthy, Joanna Signed: 06/12/2017 5:30:29 PM By: Lenda KelpStone III, Saint Hank PA-C Entered By: Lenda KelpStone III, Larkin Alfred on 06/12/2017 11:09:48 Hunter ClarksSTACEY, Thijs K. ([id number]) -------------------------------------------------------------------------------- SuperBill Details Patient Name: Hunter ClarksSTACEY, Salah K. Date of Service: 06/12/2017 Medical Record Number: [id number] Patient Account Number: [id number] Date of Birth/Sex: 10/04/1970 [age] y.o. Male) Treating RN: Curtis Sitesorthy, Joanna Primary Care Provider: PATIENT, NO Other Clinician: Referring Provider: Janalyn HarderSINGER, SAMANTHA Treating Provider/Extender: Linwood DibblesSTONE III, Detrell Umscheid Weeks in Treatment: 1 Diagnosis Coding ICD-10 Codes Code Description S61.303A Unspecified open wound of left middle finger with damage to nail, initial encounter S62.663B Nondisplaced fracture of distal phalanx of left middle finger, initial encounter for open fracture F17.218 Nicotine dependence, cigarettes, with other nicotine-induced disorders Facility Procedures CPT4 Code: [id number]  Description: 99213 - WOUND CARE VISIT-LEV 3 EST PT Modifier: Quantity: 1 Physician Procedures CPT4: Description Modifier Quantity Code [id number] 99213 - WC PHYS LEVEL 3 - EST PT 1 ICD-10 Diagnosis Description S61.303A Unspecified open wound of left middle finger with damage to nail, initial encounter S62.663B Nondisplaced fracture of distal phalanx  of left middle finger, initial encounter for open fracture F17.218 Nicotine dependence, cigarettes, with other nicotine-induced  disorders Electronic Signature(s) Signed: 06/12/2017 5:30:29 PM By: Lenda Kelp PA-C Entered By: Lenda Kelp on 06/12/2017 11:12:13

## 2017-06-19 ENCOUNTER — Encounter: Payer: Worker's Compensation | Admitting: Physician Assistant

## 2017-06-19 DIAGNOSIS — S62663B Nondisplaced fracture of distal phalanx of left middle finger, initial encounter for open fracture: Secondary | ICD-10-CM | POA: Diagnosis not present

## 2017-06-20 NOTE — Progress Notes (Signed)
[name], Lynk K. ([id number]) Visit Report for 06/19/2017 Arrival Information Details Patient Name: [name], Hunter K. Date of Service: 06/19/2017 9:15 AM Medical Record Number: [id number] Patient Account Number: [id number] Date of Birth/Sex: 04/02/1971 [age] y.o. Male) Treating RN: Huel CoventryWoody, Kim Primary Care Emmamae Mcnamara: PATIENT, NO Other Clinician: Referring Titiana Severa: Janalyn HarderSINGER, SAMANTHA Treating Brittnee Gaetano/Extender: Linwood DibblesSTONE III, HOYT Weeks in Treatment: 2 Visit Information History Since Last Visit Added or deleted any medications: No Patient Arrived: Ambulatory Any new allergies or adverse reactions: No Arrival Time: 09:27 Had a fall or experienced change in No Accompanied By: self activities of daily living that may affect Transfer Assistance: None risk of falls: Patient Identification Verified: Yes Signs or symptoms of abuse/neglect since last visito No Secondary Verification Process Completed: Yes Hospitalized since last visit: No Has Dressing in Place as Prescribed: Yes Pain Present Now: No Electronic Signature(s) Signed: 06/19/2017 10:28:27 AM By: Elliot GurneyWoody, BSN, RN, CWS, Kim RN, BSN Entered By: Elliot GurneyWoody, BSN, RN, CWS, Kim on 06/19/2017 09:28:20 [name], Hunter K. ([id number]) -------------------------------------------------------------------------------- Encounter Discharge Information Details Patient Name: [name], Avin K. Date of Service: 06/19/2017 9:15 AM Medical Record Number: [id number] Patient Account Number: [id number] Date of Birth/Sex: 03/21/1971 [age] y.o. Male) Treating RN: Phillis HaggisPinkerton, Debi Primary Care Dariya Gainer: PATIENT, NO Other Clinician: Referring Torre Schaumburg: Janalyn HarderSINGER, SAMANTHA Treating Penn Grissett/Extender: Linwood DibblesSTONE III, HOYT Weeks in Treatment: 2 Encounter Discharge Information Items Discharge Pain Level: 0 Discharge Condition: Stable Ambulatory Status: Ambulatory Discharge Destination: Home Transportation: Private Auto Accompanied By: self Schedule Follow-up Appointment:  Yes Medication Reconciliation completed and No provided to Patient/Care Royalty Fakhouri: Provided on Clinical Summary of Care: 06/19/2017 Form Type Recipient Paper Patient TS Electronic Signature(s) Signed: 06/19/2017 10:52:58 AM By: Alejandro MullingPinkerton, Debra Entered By: Alejandro MullingPinkerton, Debra on 06/19/2017 10:52:58 [name], Lamine K. ([id number]) -------------------------------------------------------------------------------- Lower Extremity Assessment Details Patient Name: [name], Hunter K. Date of Service: 06/19/2017 9:15 AM Medical Record Number: [id number] Patient Account Number: [id number] Date of Birth/Sex: 02/14/1971 [age] y.o. Male) Treating RN: Huel CoventryWoody, Kim Primary Care Aurielle Slingerland: PATIENT, NO Other Clinician: Referring Virgilene Stryker: Janalyn HarderSINGER, SAMANTHA Treating Jamiracle Avants/Extender: Lenda KelpSTONE III, HOYT Weeks in Treatment: 2 Electronic Signature(s) Signed: 06/19/2017 10:28:27 AM By: Elliot GurneyWoody, BSN, RN, CWS, Kim RN, BSN Entered By: Elliot GurneyWoody, BSN, RN, CWS, Kim on 06/19/2017 09:33:48 [name], Jontae K. ([id number]) -------------------------------------------------------------------------------- Multi Wound Chart Details Patient Name: [name], Hunter K. Date of Service: 06/19/2017 9:15 AM Medical Record Number: [id number] Patient Account Number: [id number] Date of Birth/Sex: 04/13/1971 [age] y.o. Male) Treating RN: Phillis HaggisPinkerton, Debi Primary Care Weylin Plagge: PATIENT, NO Other Clinician: Referring Marisol Glazer: Janalyn HarderSINGER, SAMANTHA Treating Nyree Applegate/Extender: STONE III, HOYT Weeks in Treatment: 2 Vital Signs Height(in): 76 Pulse(bpm): 76 Weight(lbs): 235 Blood Pressure(mmHg): 160/94 Body Mass Index(BMI): 29 Temperature(F): 98.1 Respiratory Rate 16 (breaths/min): Photos: [1:No Photos] [N/A:N/A] Wound Location: [1:Left Hand - 3rd Digit] [N/A:N/A] Wounding Event: [1:Trauma] [N/A:N/A] Primary Etiology: [1:Trauma, Other] [N/A:N/A] Date Acquired: [1:05/23/2017] [N/A:N/A] Weeks of Treatment: [1:2] [N/A:N/A] Wound Status: [1:Open]  [N/A:N/A] Pending Amputation on [1:Yes] [N/A:N/A] Presentation: Measurements L x W x D [1:2.4x1.2x0.1] [N/A:N/A] (cm) Area (cm) : [1:2.262] [N/A:N/A] Volume (cm) : [1:0.226] [N/A:N/A] % Reduction in Area: [1:36.00%] [N/A:N/A] % Reduction in Volume: [1:36.00%] [N/A:N/A] Classification: [1:Full Thickness With Exposed Support Structures] [N/A:N/A] Debridement: [1:Debridement (11042-11047)] [N/A:N/A] Pre-procedure [1:10:00] [N/A:N/A] Verification/Time Out Taken: Pain Control: [1:Lidocaine 4% Topical Solution] [N/A:N/A] Tissue Debrided: [1:Fibrin/Slough, Subcutaneous] [N/A:N/A] Level: [1:Skin/Subcutaneous Tissue] [N/A:N/A] Debridement Area (sq cm): [1:0.25] [N/A:N/A] Instrument: [1:Curette] [N/A:N/A] Bleeding: [1:Minimum] [N/A:N/A] Hemostasis Achieved: [1:Pressure] [N/A:N/A] Procedural Pain: [1:0] [N/A:N/A] Post Procedural Pain: [1:0] [N/A:N/A] Debridement Treatment [1:Procedure was tolerated well] [N/A:N/A] Response: Post Debridement [1:2.4x1.2x0.2] [N/A:N/A] Measurements  L x W x D (cm) Post Debridement Volume: [1:0.452] [N/A:N/A] (cm) Periwound Skin Texture: [1:No Abnormalities Noted] [N/A:N/A] Periwound Skin Moisture: No Abnormalities Noted N/A N/A Periwound Skin Color: No Abnormalities Noted N/A N/A Tenderness on Palpation: No N/A N/A Procedures Performed: Debridement N/A N/A Treatment Notes Wound #1 (Left Hand - 3rd Digit) 1. Cleansed with: Clean wound with Normal Saline 2. Anesthetic Topical Lidocaine 4% cream to wound bed prior to debridement 4. Dressing Applied: Prisma Ag 5. Secondary Dressing Applied Kerlix/Conform Non-Adherent pad Notes netting Electronic Signature(s) Signed: 06/19/2017 10:54:13 AM By: Alejandro Mulling Previous Signature: 06/19/2017 10:28:27 AM Version By: Elliot Gurney, BSN, RN, CWS, Kim RN, BSN Entered By: Alejandro Mulling on 06/19/2017 10:54:13 [name]  ([id number]) -------------------------------------------------------------------------------- Multi-Disciplinary Care Plan Details Patient Name: [name], COFER. Date of Service: 06/19/2017 9:15 AM Medical Record Number: [id number] Patient Account Number: [id number] Date of Birth/Sex: [birth year] (47 y.o. Male) Treating RN: Phillis Haggis Primary Care Gaige Sebo: PATIENT, NO Other Clinician: Referring Shirell Struthers: Janalyn Harder Treating Malley Hauter/Extender: Linwood Dibbles, HOYT Weeks in Treatment: 2 Active Inactive ` Orientation to the Wound Care Program Nursing Diagnoses: Knowledge deficit related to the wound healing center program Goals: Patient/caregiver will verbalize understanding of the Wound Healing Center Program Date Initiated: 06/02/2017 Target Resolution Date: 08/12/2017 Goal Status: Active Interventions: Provide education on orientation to the wound center Notes: ` Wound/Skin Impairment Nursing Diagnoses: Impaired tissue integrity Goals: Ulcer/skin breakdown will heal within 14 weeks Date Initiated: 06/02/2017 Target Resolution Date: 08/12/2017 Goal Status: Active Interventions: Assess patient/caregiver ability to obtain necessary supplies Assess patient/caregiver ability to perform ulcer/skin care regimen upon admission and as needed Assess ulceration(s) every visit Notes: Electronic Signature(s) Signed: 06/19/2017 10:53:55 AM By: Alejandro Mulling Previous Signature: 06/19/2017 10:28:27 AM Version By: Elliot Gurney, BSN, RN, CWS, Kim RN, BSN Entered By: Alejandro Mulling on 06/19/2017 10:53:54 [name] ([id number]) -------------------------------------------------------------------------------- Pain Assessment Details Patient Name: [name]. Date of Service: 06/19/2017 9:15 AM Medical Record Number: [id number] Patient Account Number: [id number] Date of Birth/Sex: May 16, 1971 (47 y.o. Male) Treating RN: Huel Coventry Primary Care Idabelle Mcpeters: PATIENT, NO Other  Clinician: Referring Elonzo Sopp: Janalyn Harder Treating Brenson Hartman/Extender: Linwood Dibbles, HOYT Weeks in Treatment: 2 Active Problems Location of Pain Severity and Description of Pain Patient Has Paino No Site Locations With Dressing Change: No Pain Management and Medication Current Pain Management: Goals for Pain Management Topical or injectable lidocaine is offered to patient for acute pain when surgical debridement is performed. If needed, Patient is instructed to use over the counter pain medication for the following 24-48 hours after debridement. Wound care MDs do not prescribed pain medications. Patient has chronic pain or uncontrolled pain. Patient has been instructed to make an appointment with their Primary Care Physician for pain management. Electronic Signature(s) Signed: 06/19/2017 10:28:27 AM By: Elliot Gurney, BSN, RN, CWS, Kim RN, BSN Entered By: Elliot Gurney, BSN, RN, CWS, Kim on 06/19/2017 24:40:10 [name] ([id number]) -------------------------------------------------------------------------------- Patient/Caregiver Education Details Patient Name: [name], SCHARA. Date of Service: 06/19/2017 9:15 AM Medical Record Number: [id number] Patient Account Number: [id number] Date of Birth/Gender: Apr 16, 1971 (47 y.o. Male) Treating RN: Phillis Haggis Primary Care Physician: PATIENT, NO Other Clinician: Referring Physician: Janalyn Harder Treating Physician/Extender: Skeet Simmer in Treatment: 2 Education Assessment Education Provided To: Patient Education Topics Provided Wound/Skin Impairment: Handouts: Caring for Your Ulcer, Other: change dressing as ordered Methods: Demonstration, Explain/Verbal Responses: State content correctly Electronic Signature(s) Signed: 06/19/2017 5:03:00 PM By: Alejandro Mulling Previous Signature: 06/19/2017 10:28:27 AM Version By: Elliot Gurney, BSN, RN, CWS, Kim RN,  BSN Entered By: Alejandro Mulling on 06/19/2017 10:53:18 [name]  ([id number]) -------------------------------------------------------------------------------- Wound Assessment Details Patient Name: [name], MAZZUCA. Date of Service: 06/19/2017 9:15 AM Medical Record Number: [id number] Patient Account Number: [id number] Date of Birth/Sex: 06-16-1970 (47 y.o. Male) Treating RN: Huel Coventry Primary Care Nicholus Chandran: PATIENT, NO Other Clinician: Referring Eliezer Khawaja: Janalyn Harder Treating Tashira Torre/Extender: Linwood Dibbles, HOYT Weeks in Treatment: 2 Wound Status Wound Number: 1 Primary Etiology: Trauma, Other Wound Location: Left Hand - 3rd Digit Wound Status: Open Wounding Event: Trauma Date Acquired: 05/23/2017 Weeks Of Treatment: 2 Clustered Wound: No Pending Amputation On Presentation Wound Measurements Length: (cm) 2.4 Width: (cm) 1.2 Depth: (cm) 0.1 Area: (cm) 2.262 Volume: (cm) 0.226 % Reduction in Area: 36% % Reduction in Volume: 36% Wound Description Full Thickness With Exposed Support Classification: Structures Periwound Skin Texture Texture Color No Abnormalities Noted: No No Abnormalities Noted: No Moisture No Abnormalities Noted: No Treatment Notes Wound #1 (Left Hand - 3rd Digit) 1. Cleansed with: Clean wound with Normal Saline 2. Anesthetic Topical Lidocaine 4% cream to wound bed prior to debridement 4. Dressing Applied: Prisma Ag 5. Secondary Dressing Applied Kerlix/Conform Non-Adherent pad Notes netting Electronic Signature(s) Signed: 06/19/2017 10:28:27 AM By: Elliot Gurney, BSN, RN, CWS, Kim RN, BSN Entered By: Elliot Gurney, BSN, RN, CWS, Kim on 06/19/2017 09:33:33 [name], [name] ([id number]) [name], [name] ([id number]) -------------------------------------------------------------------------------- Vitals Details Patient Name: [name], KWIATEK. Date of Service: 06/19/2017 9:15 AM Medical Record Number: [id number] Patient Account Number: [id number] Date of Birth/Sex: February 14, 1971 (47 y.o. Male) Treating RN: Huel Coventry Primary  Care Marolyn Urschel: PATIENT, NO Other Clinician: Referring Zuria Fosdick: Janalyn Harder Treating Flynt Breeze/Extender: Linwood Dibbles, HOYT Weeks in Treatment: 2 Vital Signs Time Taken: 09:28 Temperature (F): 98.1 Height (in): 76 Pulse (bpm): 76 Weight (lbs): 235 Respiratory Rate (breaths/min): 16 Body Mass Index (BMI): 28.6 Blood Pressure (mmHg): 160/94 Reference Range: 80 - 120 mg / dl Notes Patient states BP is high at times. Does not take medications for BP. Patient instructed to follow-up with PCP regarding BP. PA Notified. Electronic Signature(s) Signed: 06/19/2017 10:28:27 AM By: Elliot Gurney, BSN, RN, CWS, Kim RN, BSN Entered By: Elliot Gurney, BSN, RN, CWS, Kim on 06/19/2017 09:30:12

## 2017-06-20 NOTE — Progress Notes (Addendum)
LESLIE, LANGILLE ([id number]) Visit Report for 06/19/2017 Chief Complaint Document Details Patient Name: [name], [name]. Date of Service: 06/19/2017 9:15 AM Medical Record Number: [id number] Patient Account Number: [id number] Date of Birth/Sex: 09/15/1970 (47 y.o. Male) Treating RN: Huel Coventry Primary Care Provider: PATIENT, NO Other Clinician: Referring Provider: Janalyn Harder Treating Provider/Extender: Linwood Dibbles, [name] Weeks in Treatment: 2 Information Obtained from: Patient Chief Complaint Patient seen for complaints of Non-Healing Wound to the distal phalanx of the left middle finger which he has had since 05/23/2017 Electronic Signature(s) Signed: 06/20/2017 10:30:07 AM By: Lenda Kelp PA-C Entered By: Lenda Kelp on 06/19/2017 09:40:49 [name] ([id number]) -------------------------------------------------------------------------------- Debridement Details Patient Name: [name]. Date of Service: 06/19/2017 9:15 AM Medical Record Number: [id number] Patient Account Number: [id number] Date of Birth/Sex: May 02, 1971 (47 y.o. Male) Treating RN: Huel Coventry Primary Care Provider: PATIENT, NO Other Clinician: Referring Provider: Janalyn Harder Treating Provider/Extender: Linwood Dibbles, [name] Weeks in Treatment: 2 Debridement Performed for Wound #1 Left Hand - 3rd Digit Assessment: Performed By: Physician [name], [name] E., PA-C Debridement: Debridement Pre-procedure Verification/Time Yes - 10:00 Out Taken: Start Time: 10:01 Pain Control: Lidocaine 4% Topical Solution Level: Skin/Subcutaneous Tissue Total Area Debrided (L x W): 0.5 (cm) x 0.5 (cm) = 0.25 (cm) Tissue and other material Fibrin/Slough, Subcutaneous debrided: Instrument: Curette Bleeding: Minimum Hemostasis Achieved: Pressure End Time: 10:03 Procedural Pain: 0 Post Procedural Pain: 0 Response to Treatment: Procedure was tolerated well Post Debridement Measurements of Total  Wound Length: (cm) 2.4 Width: (cm) 1.2 Depth: (cm) 0.2 Volume: (cm) 0.452 Character of Wound/Ulcer Post Debridement: Requires Further Debridement Post Procedure Diagnosis Same as Pre-procedure Electronic Signature(s) Signed: 06/19/2017 10:28:27 AM By: Elliot Gurney, BSN, RN, CWS, Kim RN, BSN Signed: 06/20/2017 10:30:07 AM By: Lenda Kelp PA-C Entered By: Elliot Gurney, BSN, RN, CWS, Kim on 06/19/2017 10:03:03 [name] ([id number]) -------------------------------------------------------------------------------- HPI Details Patient Name: [name], [name]. Date of Service: 06/19/2017 9:15 AM Medical Record Number: [id number] Patient Account Number: [id number] Date of Birth/Sex: [birth year] (47 y.o. Male) Treating RN: Huel Coventry Primary Care Provider: PATIENT, NO Other Clinician: Referring Provider: Janalyn Harder Treating Provider/Extender: Linwood Dibbles, [name] Weeks in Treatment: 2 History of Present Illness Location: and left middle finger Quality: Patient reports experiencing a dull pain to affected area(s). Severity: Patient states wound are getting worse. Duration: Patient has had the wound for < 2 weeks prior to presenting for treatment Timing: Pain in wound is Intermittent (comes and goes Context: The wound occurred when the patient got his finger crushed in a grinder while he was at work and has seen the [employer] unit at [employer]. Modifying Factors: Other treatment(s) tried include:x-ray taken and put on an antibiotic Associated Signs and Symptoms: Patient reports having increase swelling. HPI Description: this 47 year old young man was referred by the [employer] team at [facility] employee health and wellness at [facility] and has a injury to the left middle finger distal phalanx on 05/23/2017. He sustained a crush injury in a grinder and the distal tip of the left middle finger was ripped and torn off including the fingernail. He had an x-ray of the  left hand IMPRESSION:1. Middle finger distal soft tissue laceration with loss of soft tissue to the finger tip. Subtle evidence of minimal fracturing of the distal margin of the distal tuft. No other fracture. No dislocation. No radiopaque foreign body. the patient does not have any significant past medical history but is a smoker and smokes about a  pack of cigarettes a day. The patient was prescribed Cefadroxil 500 mg twice a day for 7 days and given some dressing changes and asked to see the wound care. the patient was not referred to hand a Engineer, petroleum. 06/12/17 on evaluation today patient appears to be doing decently well in regard to the injury only distal portion of his left middle finger. He is having some discomfort unfortunately. Nonetheless he has not seen the orthopedic specialist as of yet that we referred him to. He states he really wasn't sure if that was something that was necessary or not and subsequently he also states that he has had a difficult time getting in touch with the case manager and adjuster on his case over the holiday. Nonetheless he has continued to perform the dressing changes appropriately and the wound does seem to be showing signs of improvement which is excellent news. Upon review of the x-ray it does appear that the fracture of the tuft was minimal nonetheless I do think she needs orthopedic specialist to monitor this as far as ensuring complete healing and obviously rate and releasing the patient once everything is said and done. 06/19/17 on evaluation today patient's wound on the tip of his left middle finger appears to be doing much better. The [employer] Popper has been doing its job and a lot of the slough was very loose. At the very tip he did have a little bit of slough that was more adherent. There does not appear to be any infection and he actually has an appointment with orthopedics tomorrow at [facility] for evaluation in regard to the tuft fracture. He seems  to still be having some pain from likely this fracture. Electronic Signature(s) Signed: 06/20/2017 10:30:07 AM By: Lenda Kelp PA-C Entered By: Lenda Kelp on 06/19/2017 10:10:00 [name] ([id number]) -------------------------------------------------------------------------------- Physical Exam Details Patient Name: [name], [name]. Date of Service: 06/19/2017 9:15 AM Medical Record Number: [id number] Patient Account Number: [id number] Date of Birth/Sex: [birth year] (47 y.o. Male) Treating RN: Huel Coventry Primary Care Provider: PATIENT, NO Other Clinician: Referring Provider: Janalyn Harder Treating Provider/Extender: [name], [name] Weeks in Treatment: 2 Constitutional Well-nourished and well-hydrated in no acute distress. Respiratory normal breathing without difficulty. Psychiatric this patient is able to make decisions and demonstrates good insight into disease process. Alert and Oriented x 3. pleasant and cooperative. Notes Patient's wound for the most part was able to be cleaned with gauze and saline. However on the tip he still had some adherent slough that was not easily removed in this manner. This did require sharp debridement which he tolerated better actually then cleans and with the gauze and saline. No significant bleeding was noted post debridement. Electronic Signature(s) Signed: 06/20/2017 10:30:07 AM By: Lenda Kelp PA-C Entered By: Lenda Kelp on 06/19/2017 10:12:53 [name] ([id number]) -------------------------------------------------------------------------------- Physician Orders Details Patient Name: [name] Date of Service: 06/19/2017 9:15 AM Medical Record Number: [id number] Patient Account Number: [id number] Date of Birth/Sex: 03/03/1971 (47 y.o. Male) Treating RN: Phillis Haggis Primary Care Provider: PATIENT, NO Other Clinician: Referring Provider: Janalyn Harder Treating Provider/Extender: Linwood Dibbles, [name] Weeks  in Treatment: 2 Verbal / Phone Orders: Yes ClinicianAshok Cordia, Debi Read Back and Verified: Yes Diagnosis Coding ICD-10 Coding Code Description S61.303A Unspecified open wound of left middle finger with damage to nail, initial encounter S62.663B Nondisplaced fracture of distal phalanx of left middle finger, initial encounter for open fracture F17.218 Nicotine dependence, cigarettes, with other nicotine-induced disorders Wound Cleansing Wound #  1 Left Hand - 3rd Digit o Clean wound with Normal Saline. o Cleanse wound with mild soap and water o May Shower, gently pat wound dry prior to applying new dressing. Anesthetic (add to Medication List) Wound #1 Left Hand - 3rd Digit o Topical Lidocaine 4% cream applied to wound bed prior to debridement (In Clinic Only). Primary Wound Dressing Wound #1 Left Hand - 3rd Digit o Prisma Ag Secondary Dressing Wound #1 Left Hand - 3rd Digit o Gauze and Kerlix/Conform o Other - netting #2 Dressing Change Frequency Wound #1 Left Hand - 3rd Digit o Change dressing every day. Follow-up Appointments Wound #1 Left Hand - 3rd Digit o Return Appointment in 1 week. Additional Orders / Instructions Wound #1 Left Hand - 3rd Digit o Stop Smoking o Vitamin A; Vitamin C, Zinc - over the counter supplements o Increase protein intake. Patient Medications [name], [name] ([id number]) Allergies: No Known Allergies Notifications Medication Indication Start End lidocaine DOSE 1 - topical 4 % cream - 1 cream topical Electronic Signature(s) Signed: 06/19/2017 5:03:00 PM By: Alejandro Mulling Signed: 06/20/2017 10:30:07 AM By: Lenda Kelp PA-C Previous Signature: 06/19/2017 10:28:27 AM Version By: Elliot Gurney, BSN, RN, CWS, Kim RN, BSN Entered By: Alejandro Mulling on 06/19/2017 10:54:39 [name], [name] ([id number]) -------------------------------------------------------------------------------- Prescription 06/19/2017 Patient Name:  MEADE, HOGELAND. Provider: Lenda Kelp PA-C Date of Birth: January 16, 1971 NPI#: [id number] Sex: Judie Petit DEA#: [id number] Phone #: [phone] License #: Patient Address: [facility] [address] [facility] [place], [place] [address] [address], [address] [place], [place] [address] [phone] Allergies No Known Allergies Medication Medication: Route: Strength: Form: lidocaine 4 % topical cream topical 4% cream Class: TOPICAL LOCAL ANESTHETICS Dose: Frequency / Time: Indication: 1 1 cream topical Number of Refills: Number of Units: 0 Generic Substitution: Start Date: End Date: One Time Use: Substitution Permitted No Note to Pharmacy: Signature(s): Date(s): Electronic Signature(s) Signed: 06/19/2017 5:03:00 PM By: Alejandro Mulling Signed: 06/20/2017 10:30:07 AM By: Lenda Kelp PA-C Previous Signature: 06/19/2017 10:28:27 AM Version By: Elliot Gurney, BSN, RN, CWS, Kim RN, BSN Entered By: Alejandro Mulling on 06/19/2017 10:54:40 [name] ([id number]) --------------------------------------------------------------------------------  Problem List Details Patient Name: [name], [name]. Date of Service: 06/19/2017 9:15 AM Medical Record Number: [id number] Patient Account Number: [id number] Date of Birth/Sex: 1971/03/19 (47 y.o. Male) Treating RN: Huel Coventry Primary Care Provider: PATIENT, NO Other Clinician: Referring Provider: Janalyn Harder Treating Provider/Extender: Linwood Dibbles, [name] Weeks in Treatment: 2 Active Problems ICD-10 Encounter Code Description Active Date Diagnosis S61.303A Unspecified open wound of left middle finger with damage to nail, 06/02/2017 Yes initial encounter S62.663B Nondisplaced fracture of distal phalanx of left middle finger, initial 06/02/2017 Yes encounter for open fracture F17.218 Nicotine dependence, cigarettes, with other nicotine-induced 06/02/2017  Yes disorders Inactive Problems Resolved Problems Electronic Signature(s) Signed: 06/20/2017 10:30:07 AM By: Lenda Kelp PA-C Entered By: Lenda Kelp on 06/19/2017 09:40:40 Streeter, Vincent Peyer ([id number]) -------------------------------------------------------------------------------- Progress Note Details Patient Name: [name] Date of Service: 06/19/2017 9:15 AM Medical Record Number: [id number] Patient Account Number: [id number] Date of Birth/Sex: 02-Jul-1970 (47 y.o. Male) Treating RN: Huel Coventry Primary Care Provider: PATIENT, NO Other Clinician: Referring Provider: Janalyn Harder Treating Provider/Extender: Linwood Dibbles, [name] Weeks in Treatment: 2 Subjective Chief Complaint Information obtained from Patient Patient seen for complaints of Non-Healing Wound to the distal phalanx of the left middle finger which he has had since 05/23/2017 History of Present Illness (HPI) The following  HPI elements were documented for the patient's wound: Location: and left middle finger Quality: Patient reports experiencing a dull pain to affected area(s). Severity: Patient states wound are getting worse. Duration: Patient has had the wound for < 2 weeks prior to presenting for treatment Timing: Pain in wound is Intermittent (comes and goes Context: The wound occurred when the patient got his finger crushed in a grinder while he was at work and has seen the [employer] unit at [employer]. Modifying Factors: Other treatment(s) tried include:x-ray taken and put on an antibiotic Associated Signs and Symptoms: Patient reports having increase swelling. this 47 year old young man was referred by the [employer] team at [facility] employee health and wellness at [facility] and has a injury to the left middle finger distal phalanx on 05/23/2017. He sustained a crush injury in a grinder and the distal tip of the left middle finger was ripped and torn off including  the fingernail. He had an x-ray of the left hand IMPRESSION:1. Middle finger distal soft tissue laceration with loss of soft tissue to the finger tip. Subtle evidence of minimal fracturing of the distal margin of the distal tuft. No other fracture. No dislocation. No radiopaque foreign body. the patient does not have any significant past medical history but is a smoker and smokes about a pack of cigarettes a day. The patient was prescribed Cefadroxil 500 mg twice a day for 7 days and given some dressing changes and asked to see the wound care. the patient was not referred to hand a Engineer, petroleum. 06/12/17 on evaluation today patient appears to be doing decently well in regard to the injury only distal portion of his left middle finger. He is having some discomfort unfortunately. Nonetheless he has not seen the orthopedic specialist as of yet that we referred him to. He states he really wasn't sure if that was something that was necessary or not and subsequently he also states that he has had a difficult time getting in touch with the case manager and adjuster on his case over the holiday. Nonetheless he has continued to perform the dressing changes appropriately and the wound does seem to be showing signs of improvement which is excellent news. Upon review of the x-ray it does appear that the fracture of the tuft was minimal nonetheless I do think she needs orthopedic specialist to monitor this as far as ensuring complete healing and obviously rate and releasing the patient once everything is said and done. 06/19/17 on evaluation today patient's wound on the tip of his left middle finger appears to be doing much better. The [employer] Popper has been doing its job and a lot of the slough was very loose. At the very tip he did have a little bit of slough that was more adherent. There does not appear to be any infection and he actually has an appointment with orthopedics tomorrow at [facility] for evaluation in  regard to the tuft fracture. He seems to still be having some pain from likely this fracture. Patient History Information obtained from Patient. Family History [name], [name] ([id number]) No family history of Cancer, Diabetes, Heart Disease, Hereditary Spherocytosis, Hypertension, Kidney Disease, Lung Disease, Seizures, Stroke, Thyroid Problems, Tuberculosis. Social History Current every day smoker - 1/2 pack to 1 pack, Marital Status - Single, Alcohol Use - Rarely, Drug Use - Current History - marijana occasionally, Caffeine Use - Daily. Review of Systems (ROS) Constitutional Symptoms (General Health) Denies complaints or symptoms of Fever, Chills. Respiratory The  patient has no complaints or symptoms. Cardiovascular The patient has no complaints or symptoms. Psychiatric The patient has no complaints or symptoms. Objective Constitutional Well-nourished and well-hydrated in no acute distress. Vitals Time Taken: 9:28 AM, Height: 76 in, Weight: 235 lbs, BMI: 28.6, Temperature: 98.1 F, Pulse: 76 bpm, Respiratory Rate: 16 breaths/min, Blood Pressure: 160/94 mmHg. General Notes: Patient states BP is high at times. Does not take medications for BP. Patient instructed to follow-up with PCP regarding BP. PA Notified. Respiratory normal breathing without difficulty. Psychiatric this patient is able to make decisions and demonstrates good insight into disease process. Alert and Oriented x 3. pleasant and cooperative. General Notes: Patient's wound for the most part was able to be cleaned with gauze and saline. However on the tip he still had some adherent slough that was not easily removed in this manner. This did require sharp debridement which he tolerated better actually then cleans and with the gauze and saline. No significant bleeding was noted post debridement. Integumentary (Hair, Skin) Wound #1 status is Open. Original cause of wound was Trauma. The wound is located on the Left  Hand - 3rd Digit. The wound measures 2.4cm length x 1.2cm width x 0.1cm depth; 2.262cm^2 area and 0.226cm^3 volume. Assessment Active Problems GRIFFEN, FRAYNE ([id number]) ICD-10 S61.303A - Unspecified open wound of left middle finger with damage to nail, initial encounter S62.663B - Nondisplaced fracture of distal phalanx of left middle finger, initial encounter for open fracture F17.218 - Nicotine dependence, cigarettes, with other nicotine-induced disorders Procedures Wound #1 Pre-procedure diagnosis of Wound #1 is a Trauma, Other located on the Left Hand - 3rd Digit . There was a Skin/Subcutaneous Tissue Debridement ([id number]) debridement with total area of 0.25 sq cm performed by [name], [name] E., PA-C. with the following instrument(s): Curette including Fibrin/Slough and Subcutaneous after achieving pain control using Lidocaine 4% Topical Solution. A time out was conducted at 10:00, prior to the start of the procedure. A Minimum amount of bleeding was controlled with Pressure. The procedure was tolerated well with a pain level of 0 throughout and a pain level of 0 following the procedure. Post Debridement Measurements: 2.4cm length x 1.2cm width x 0.2cm depth; 0.452cm^3 volume. Character of Wound/Ulcer Post Debridement requires further debridement. Post procedure Diagnosis Wound #1: Same as Pre-Procedure Plan Wound Cleansing: Wound #1 Left Hand - 3rd Digit: Clean wound with Normal Saline. Cleanse wound with mild soap and water May Shower, gently pat wound dry prior to applying new dressing. Anesthetic (add to Medication List): Wound #1 Left Hand - 3rd Digit: Topical Lidocaine 4% cream applied to wound bed prior to debridement (In Clinic Only). Primary Wound Dressing: Wound #1 Left Hand - 3rd Digit: Prisma Ag Secondary Dressing: Wound #1 Left Hand - 3rd Digit: Gauze and Kerlix/Conform Other - netting #2 Dressing Change Frequency: Wound #1 Left Hand - 3rd Digit: Change  dressing every day. Follow-up Appointments: Wound #1 Left Hand - 3rd Digit: Return Appointment in 1 week. Additional Orders / Instructions: Wound #1 Left Hand - 3rd Digit: Stop Smoking Vitamin A; Vitamin C, Zinc - over the counter supplements Increase protein intake. The following medication(s) was prescribed: lidocaine topical 4 % cream 1 1 cream topical was prescribed at facility [name], [name] ([id number]) At this point I'm going to recommend that we continue with the current referral for evaluation with orthopedic this is definitely needed. Subsequently I'm also gonna recommend switching him to a silver collagen dressing to hopefully help with more rapid improvement  of his wound in general. Patient is in agreement with this plan. He will hold onto the [facility] for the time being just in case we need this again in the future. Otherwise we will see him for reevaluation in one weeks time to see how his wound is doing we will see what orthopedics has to say regarding the tuft fracture Please see above for specific wound care orders. We will see patient for re-evaluation in 1 week(s) here in the clinic. If anything worsens or changes patient will contact our office for additional recommendations. Electronic Signature(s) Signed: 07/10/2017 4:50:01 PM By: Lenda Kelp PA-C Previous Signature: 06/20/2017 10:30:07 AM Version By: Lenda Kelp PA-C Entered By: Lenda Kelp on 07/10/2017 09:01:38 [name] ([id number]) -------------------------------------------------------------------------------- ROS/PFSH Details Patient Name: [name] Date of Service: 06/19/2017 9:15 AM Medical Record Number: [id number] Patient Account Number: [id number] Date of Birth/Sex: 1971-05-11 (47 y.o. Male) Treating RN: Huel Coventry Primary Care Provider: PATIENT, NO Other Clinician: Referring Provider: Janalyn Harder Treating Provider/Extender: Linwood Dibbles, [name] Weeks in Treatment: 2 Information  Obtained From Patient Wound History Do you currently have one or more open woundso Yes How many open wounds do you currently haveo 1 Approximately how long have you had your woundso 10 days How have you been treating your wound(s) until nowo dry bandage Has your wound(s) ever healed and then re-openedo No Have you had any lab work done in the past montho No Have you tested positive for an antibiotic resistant organism (MRSA, VRE)o No Have you tested positive for osteomyelitis (bone infection)o No Have you had any tests for circulation on your legso No Constitutional Symptoms (General Health) Complaints and Symptoms: Negative for: Fever; Chills Hematologic/Lymphatic Medical History: Negative for: Anemia; Hemophilia; Human Immunodeficiency Virus; Lymphedema; Sickle Cell Disease Respiratory Complaints and Symptoms: No Complaints or Symptoms Medical History: Negative for: Aspiration; Asthma; Chronic Obstructive Pulmonary Disease (COPD); Pneumothorax; Sleep Apnea; Tuberculosis Cardiovascular Complaints and Symptoms: No Complaints or Symptoms Medical History: Negative for: Angina; Arrhythmia; Congestive Heart Failure; Coronary Artery Disease; Deep Vein Thrombosis; Hypertension; Hypotension; Myocardial Infarction; Peripheral Arterial Disease; Peripheral Venous Disease; Phlebitis; Vasculitis Gastrointestinal Medical History: Negative for: Cirrhosis ; Colitis; Crohnos; Hepatitis A; Hepatitis B; Hepatitis C Endocrine [name], [name] ([id number]) Medical History: Negative for: Type I Diabetes; Type II Diabetes Genitourinary Medical History: Negative for: End Stage Renal Disease Immunological Medical History: Negative for: Lupus Erythematosus; Raynaudos; Scleroderma Integumentary (Skin) Medical History: Negative for: History of Burn; History of pressure wounds Musculoskeletal Medical History: Negative for: Gout; Rheumatoid Arthritis; Osteoarthritis;  Osteomyelitis Neurologic Medical History: Negative for: Dementia; Neuropathy; Quadriplegia; Paraplegia; Seizure Disorder Oncologic Medical History: Negative for: Received Chemotherapy; Received Radiation Psychiatric Complaints and Symptoms: No Complaints or Symptoms Medical History: Negative for: Anorexia/bulimia; Confinement Anxiety Immunizations Pneumococcal Vaccine: Received Pneumococcal Vaccination: Yes Tetanus Vaccine: Last tetanus shot: 05/23/2017 Implantable Devices Family and Social History Cancer: No; Diabetes: No; Heart Disease: No; Hereditary Spherocytosis: No; Hypertension: No; Kidney Disease: No; Lung Disease: No; Seizures: No; Stroke: No; Thyroid Problems: No; Tuberculosis: No; Current every day smoker - 1/2 pack to 1 pack; Marital Status - Single; Alcohol Use: Rarely; Drug Use: Current History - marijana occasionally; Caffeine Use: Daily; Financial Concerns: No; Food, Clothing or Shelter Needs: No; Support System Lacking: No; Transportation Concerns: No; Advanced Directives: No; Patient does not want information on Advanced Directives; Do not resuscitate: No; Living Will: No; Medical Power of Attorney: No Physician Affirmation I have reviewed and agree with the above information. [name], [name] ([id number])  Electronic Signature(s) Signed: 06/19/2017 10:28:27 AM By: Elliot GurneyWoody, BSN, RN, CWS, Kim RN, BSN Signed: 06/20/2017 10:30:07 AM By: Lenda KelpStone Maxwell, Hoyt PA-C Entered By: Lenda KelpStone Maxwell, [name] on 06/19/2017 10:12:36 Hunter ClarksSTACEY, [name]. ([id number]) -------------------------------------------------------------------------------- SuperBill Details Patient Name: Hunter ClarksSTACEY, Rithwik Maxwell. Date of Service: 06/19/2017 Medical Record Number: [id number] Patient Account Number: [id number] Date of Birth/Sex: 09/19/1970 [age] y.o. Male) Treating RN: Huel CoventryWoody, Kim Primary Care Provider: PATIENT, NO Other Clinician: Referring Provider: Janalyn HarderSINGER, SAMANTHA Treating Provider/Extender: Linwood DibblesSTONE Maxwell, [name] Weeks in  Treatment: 2 Diagnosis Coding ICD-10 Codes Code Description S61.303A Unspecified open wound of left middle finger with damage to nail, initial encounter S62.663B Nondisplaced fracture of distal phalanx of left middle finger, initial encounter for open fracture F17.218 Nicotine dependence, cigarettes, with other nicotine-induced disorders Facility Procedures CPT4 Code Description: [id number] 11042 - DEB SUBQ TISSUE 20 SQ CM/< ICD-10 Diagnosis Description S61.303A Unspecified open wound of left middle finger with damage to nail, Modifier: initial encount Quantity: 1 er Physician Procedures CPT4 Code Description: 30865786770168 11042 - WC PHYS SUBQ TISS 20 SQ CM ICD-10 Diagnosis Description S61.303A Unspecified open wound of left middle finger with damage to nail, Modifier: initial encounter Quantity: 1 Electronic Signature(s) Signed: 06/20/2017 10:30:07 AM By: Lenda KelpStone Maxwell, Hoyt PA-C Entered By: Lenda KelpStone Maxwell, [name] on 06/19/2017 10:14:14

## 2017-06-26 ENCOUNTER — Encounter: Payer: Worker's Compensation | Admitting: Physician Assistant

## 2017-06-26 DIAGNOSIS — S62663B Nondisplaced fracture of distal phalanx of left middle finger, initial encounter for open fracture: Secondary | ICD-10-CM | POA: Diagnosis not present

## 2017-06-27 NOTE — Progress Notes (Signed)
RAHIEM, SCHELLINGER ([id number]) Visit Report for 06/26/2017 Arrival Information Details Patient Name: [name], [name]. Date of Service: 06/26/2017 10:15 AM Medical Record Number: [id number] Patient Account Number: [id number] Date of Birth/Sex: Oct 24, 1970 (47 y.o. Male) Treating RN: Huel Coventry Primary Care Aadil Sur: PATIENT, NO Other Clinician: Referring Teigen Bellin: Janalyn Harder Treating Mikel Hardgrove/Extender: Linwood Dibbles, HOYT Weeks in Treatment: 3 Visit Information History Since Last Visit Added or deleted any medications: No Patient Arrived: Ambulatory Any new allergies or adverse reactions: No Arrival Time: 10:05 Had a fall or experienced change in No Accompanied By: self activities of daily living that may affect Transfer Assistance: None risk of falls: Patient Identification Verified: Yes Signs or symptoms of abuse/neglect since last visito No Secondary Verification Process Completed: Yes Hospitalized since last visit: No Has Dressing in Place as Prescribed: Yes Pain Present Now: No Electronic Signature(s) Signed: 06/26/2017 12:22:15 PM By: Elliot Gurney, BSN, RN, CWS, Kim RN, BSN Entered By: Elliot Gurney, BSN, RN, CWS, Kim on 06/26/2017 10:06:16 [name] ([id number]) -------------------------------------------------------------------------------- Clinic Level of Care Assessment Details Patient Name: [name], [name]. Date of Service: 06/26/2017 10:15 AM Medical Record Number: [id number] Patient Account Number: [id number] Date of Birth/Sex: 09-14-1970 (47 y.o. Male) Treating RN: Huel Coventry Primary Care Toinette Lackie: PATIENT, NO Other Clinician: Referring Cornelious Diven: Janalyn Harder Treating Kahlel Peake/Extender: Linwood Dibbles, HOYT Weeks in Treatment: 3 Clinic Level of Care Assessment Items TOOL 4 Quantity Score []  - Use when only an EandM is performed on FOLLOW-UP visit 0 ASSESSMENTS - Nursing Assessment / Reassessment []  - Reassessment of Co-morbidities (includes updates in patient status) 0 X-  1 5 Reassessment of Adherence to Treatment Plan ASSESSMENTS - Wound and Skin Assessment / Reassessment X - Simple Wound Assessment / Reassessment - one wound 1 5 []  - 0 Complex Wound Assessment / Reassessment - multiple wounds []  - 0 Dermatologic / Skin Assessment (not related to wound area) ASSESSMENTS - Focused Assessment []  - Circumferential Edema Measurements - multi extremities 0 []  - 0 Nutritional Assessment / Counseling / Intervention []  - 0 Lower Extremity Assessment (monofilament, tuning fork, pulses) []  - 0 Peripheral Arterial Disease Assessment (using hand held doppler) ASSESSMENTS - Ostomy and/or Continence Assessment and Care []  - Incontinence Assessment and Management 0 []  - 0 Ostomy Care Assessment and Management (repouching, etc.) PROCESS - Coordination of Care X - Simple Patient / Family Education for ongoing care 1 15 []  - 0 Complex (extensive) Patient / Family Education for ongoing care X- 1 10 Staff obtains Chiropractor, Records, Test Results / Process Orders []  - 0 Staff telephones HHA, Nursing Homes / Clarify orders / etc []  - 0 Routine Transfer to another Facility (non-emergent condition) []  - 0 Routine Hospital Admission (non-emergent condition) []  - 0 New Admissions / Manufacturing engineer / Ordering NPWT, Apligraf, etc. []  - 0 Emergency Hospital Admission (emergent condition) X- 1 10 Simple Discharge Coordination SALATHIEL, FERRARA. ([id number]) []  - 0 Complex (extensive) Discharge Coordination PROCESS - Special Needs []  - Pediatric / Minor Patient Management 0 []  - 0 Isolation Patient Management []  - 0 Hearing / Language / Visual special needs []  - 0 Assessment of Community assistance (transportation, D/C planning, etc.) []  - 0 Additional assistance / Altered mentation []  - 0 Support Surface(s) Assessment (bed, cushion, seat, etc.) INTERVENTIONS - Wound Cleansing / Measurement X - Simple Wound Cleansing - one wound 1 5 []  - 0 Complex  Wound Cleansing - multiple wounds X- 1 5 Wound Imaging (photographs - any number of wounds) []  - 0 Wound Tracing (instead  of photographs) X- 1 5 Simple Wound Measurement - one wound []  - 0 Complex Wound Measurement - multiple wounds INTERVENTIONS - Wound Dressings []  - Small Wound Dressing one or multiple wounds 0 X- 1 15 Medium Wound Dressing one or multiple wounds []  - 0 Large Wound Dressing one or multiple wounds []  - 0 Application of Medications - topical []  - 0 Application of Medications - injection INTERVENTIONS - Miscellaneous []  - External ear exam 0 []  - 0 Specimen Collection (cultures, biopsies, blood, body fluids, etc.) []  - 0 Specimen(s) / Culture(s) sent or taken to Lab for analysis []  - 0 Patient Transfer (multiple staff / Nurse, adultHoyer Lift / Similar devices) []  - 0 Simple Staple / Suture removal (25 or less) []  - 0 Complex Staple / Suture removal (26 or more) []  - 0 Hypo / Hyperglycemic Management (close monitor of Blood Glucose) []  - 0 Ankle / Brachial Index (ABI) - do not check if billed separately X- 1 5 Vital Signs [name], Hunter K. ([id number]) Has the patient been seen at the hospital within the last three years: Yes Total Score: 80 Level Of Care: New/Established - Level 3 Electronic Signature(s) Signed: 06/26/2017 12:22:15 PM By: Elliot GurneyWoody, BSN, RN, CWS, Kim RN, BSN Entered By: Elliot GurneyWoody, BSN, RN, CWS, Kim on 06/26/2017 10:29:42 [name], Hunter K. ([id number]) -------------------------------------------------------------------------------- Encounter Discharge Information Details Patient Name: [name], Hunter K. Date of Service: 06/26/2017 10:15 AM Medical Record Number: [id number] Patient Account Number: [id number] Date of Birth/Sex: 09/01/1970 [age] y.o. Male) Treating RN: Huel CoventryWoody, Kim Primary Care Keevan Wolz: PATIENT, NO Other Clinician: Referring Aquinnah Devin: Janalyn HarderSINGER, SAMANTHA Treating Dominque Levandowski/Extender: Linwood DibblesSTONE III, HOYT Weeks in Treatment: 3 Encounter Discharge  Information Items Discharge Pain Level: 0 Discharge Condition: Stable Ambulatory Status: Ambulatory Discharge Destination: Home Private Transportation: Auto Accompanied By: self Schedule Follow-up Appointment: Yes Medication Reconciliation completed and provided Yes to Patient/Care Maeleigh Buschman: Clinical Summary of Care: Electronic Signature(s) Signed: 06/26/2017 12:22:15 PM By: Elliot GurneyWoody, BSN, RN, CWS, Kim RN, BSN Entered By: Elliot GurneyWoody, BSN, RN, CWS, Kim on 06/26/2017 10:30:32 [name], Kwane K. ([id number]) -------------------------------------------------------------------------------- Lower Extremity Assessment Details Patient Name: [name], Hunter K. Date of Service: 06/26/2017 10:15 AM Medical Record Number: [id number] Patient Account Number: [id number] Date of Birth/Sex: 11/29/1970 [age] y.o. Male) Treating RN: Huel CoventryWoody, Kim Primary Care Rustin Erhart: PATIENT, NO Other Clinician: Referring Jatoria Kneeland: Janalyn HarderSINGER, SAMANTHA Treating Melodee Lupe/Extender: Lenda KelpSTONE III, HOYT Weeks in Treatment: 3 Electronic Signature(s) Signed: 06/26/2017 12:22:15 PM By: Elliot GurneyWoody, BSN, RN, CWS, Kim RN, BSN Entered By: Elliot GurneyWoody, BSN, RN, CWS, Kim on 06/26/2017 10:11:56 [name], Hunter K. ([id number]) -------------------------------------------------------------------------------- Multi Wound Chart Details Patient Name: [name], Hunter K. Date of Service: 06/26/2017 10:15 AM Medical Record Number: [id number] Patient Account Number: [id number] Date of Birth/Sex: 12/07/1970 [age] y.o. Male) Treating RN: Huel CoventryWoody, Kim Primary Care Leyanna Bittman: PATIENT, NO Other Clinician: Referring Phyllicia Dudek: Janalyn HarderSINGER, SAMANTHA Treating Bianka Liberati/Extender: Linwood DibblesSTONE III, HOYT Weeks in Treatment: 3 Vital Signs Height(in): 76 Pulse(bpm): 74 Weight(lbs): 235 Blood Pressure(mmHg): 161/95 Body Mass Index(BMI): 29 Temperature(F): 97.7 Respiratory Rate 16 (breaths/min): Photos: [1:No Photos] [N/A:N/A] Wound Location: [1:Left Hand - 3rd Digit] [N/A:N/A] Wounding Event:  [1:Trauma] [N/A:N/A] Primary Etiology: [1:Trauma, Other] [N/A:N/A] Date Acquired: [1:05/23/2017] [N/A:N/A] Weeks of Treatment: [1:3] [N/A:N/A] Wound Status: [1:Open] [N/A:N/A] Pending Amputation on [1:Yes] [N/A:N/A] Presentation: Measurements L x W x D [1:2x1.2x0.1] [N/A:N/A] (cm) Area (cm) : [1:1.885] [N/A:N/A] Volume (cm) : [1:0.188] [N/A:N/A] % Reduction in Area: [1:46.70%] [N/A:N/A] % Reduction in Volume: [1:46.70%] [N/A:N/A] Classification: [1:Full Thickness With Exposed Support Structures] [N/A:N/A] Exudate Amount: [1:Small] [N/A:N/A] Exudate Type: [1:Serous] [N/A:N/A] Exudate Color: [1:amber] [  N/A:N/A] Wound Margin: [1:Flat and Intact] [N/A:N/A] Granulation Amount: [1:Small (1-33%)] [N/A:N/A] Granulation Quality: [1:Red] [N/A:N/A] Necrotic Amount: [1:Large (67-100%)] [N/A:N/A] Exposed Structures: [1:Fat Layer (Subcutaneous Tissue) Exposed: Yes Fascia: No Tendon: No Muscle: No Joint: No Bone: No] [N/A:N/A] Periwound Skin Texture: [1:Excoriation: Yes Induration: No Callus: No Crepitus: No Rash: No Scarring: No] [N/A:N/A] Periwound Skin Moisture: Maceration: No N/A N/A Dry/Scaly: No Periwound Skin Color: Atrophie Blanche: No N/A N/A Cyanosis: No Ecchymosis: No Erythema: No Hemosiderin Staining: No Mottled: No Pallor: No Rubor: No Temperature: No Abnormality N/A N/A Tenderness on Palpation: Yes N/A N/A Wound Preparation: Ulcer Cleansing: N/A N/A Rinsed/Irrigated with Saline Topical Anesthetic Applied: Other: lidociane 4% Treatment Notes Electronic Signature(s) Signed: 06/26/2017 12:22:15 PM By: Elliot Gurney, BSN, RN, CWS, Kim RN, BSN Entered By: Elliot Gurney, BSN, RN, CWS, Kim on 06/26/2017 10:12:35 [name] ([id number]) -------------------------------------------------------------------------------- Multi-Disciplinary Care Plan Details Patient Name: MALIKE, FOGLIO. Date of Service: 06/26/2017 10:15 AM Medical Record Number: [id number] Patient Account Number:  [id number] Date of Birth/Sex: 1970-09-23 (47 y.o. Male) Treating RN: Huel Coventry Primary Care Lillymae Duet: PATIENT, NO Other Clinician: Referring Analee Montee: Janalyn Harder Treating Shalamar Plourde/Extender: Linwood Dibbles, HOYT Weeks in Treatment: 3 Active Inactive ` Orientation to the Wound Care Program Nursing Diagnoses: Knowledge deficit related to the wound healing center program Goals: Patient/caregiver will verbalize understanding of the Wound Healing Center Program Date Initiated: 06/02/2017 Target Resolution Date: 08/12/2017 Goal Status: Active Interventions: Provide education on orientation to the wound center Notes: ` Wound/Skin Impairment Nursing Diagnoses: Impaired tissue integrity Goals: Ulcer/skin breakdown will heal within 14 weeks Date Initiated: 06/02/2017 Target Resolution Date: 08/12/2017 Goal Status: Active Interventions: Assess patient/caregiver ability to obtain necessary supplies Assess patient/caregiver ability to perform ulcer/skin care regimen upon admission and as needed Assess ulceration(s) every visit Notes: Electronic Signature(s) Signed: 06/26/2017 12:22:15 PM By: Elliot Gurney, BSN, RN, CWS, Kim RN, BSN Entered By: Elliot Gurney, BSN, RN, CWS, Kim on 06/26/2017 10:12:29 [name] ([id number]) -------------------------------------------------------------------------------- Pain Assessment Details Patient Name: [name]. Date of Service: 06/26/2017 10:15 AM Medical Record Number: [id number] Patient Account Number: [id number] Date of Birth/Sex: [birth year] (47 y.o. Male) Treating RN: Huel Coventry Primary Care Esias Mory: PATIENT, NO Other Clinician: Referring Valery Chance: Janalyn Harder Treating Damione Robideau/Extender: Linwood Dibbles, HOYT Weeks in Treatment: 3 Active Problems Location of Pain Severity and Description of Pain Patient Has Paino No Site Locations With Dressing Change: No Pain Management and Medication Current Pain Management: Electronic Signature(s) Signed:  06/26/2017 12:22:15 PM By: Elliot Gurney, BSN, RN, CWS, Kim RN, BSN Entered By: Elliot Gurney, BSN, RN, CWS, Kim on 06/26/2017 10:07:04 [name] ([id number]) -------------------------------------------------------------------------------- Patient/Caregiver Education Details Patient Name: [name] Date of Service: 06/26/2017 10:15 AM Medical Record Number: [id number] Patient Account Number: [id number] Date of Birth/Gender: [birth year] (47 y.o. Male) Treating RN: Huel Coventry Primary Care Physician: PATIENT, NO Other Clinician: Referring Physician: Janalyn Harder Treating Physician/Extender: Skeet Simmer in Treatment: 3 Education Assessment Education Provided To: Patient Education Topics Provided Wound/Skin Impairment: Handouts: Caring for Your Ulcer, Other: wound care as prescribed Methods: Demonstration, Explain/Verbal Responses: State content correctly Electronic Signature(s) Signed: 06/26/2017 12:22:15 PM By: Elliot Gurney, BSN, RN, CWS, Kim RN, BSN Entered By: Elliot Gurney, BSN, RN, CWS, Kim on 06/26/2017 10:31:18 [name] ([id number]) -------------------------------------------------------------------------------- Wound Assessment Details Patient Name: YUVAL, RUBENS. Date of Service: 06/26/2017 10:15 AM Medical Record Number: [id number] Patient Account Number: [id number] Date of Birth/Sex: January 05, 1971 (47 y.o. Male) Treating RN: Huel Coventry Primary Care Reef Achterberg: PATIENT, NO Other Clinician: Referring Murtaza Shell: Janalyn Harder Treating Wilberta Dorvil/Extender: Linwood Dibbles,  HOYT Weeks in Treatment: 3 Wound Status Wound Number: 1 Primary Etiology: Trauma, Other Wound Location: Left Hand - 3rd Digit Wound Status: Open Wounding Event: Trauma Date Acquired: 05/23/2017 Weeks Of Treatment: 3 Clustered Wound: No Pending Amputation On Presentation Photos Photo Uploaded By: Elliot Gurney, BSN, RN, CWS, Kim on 06/26/2017 10:47:38 Wound Measurements Length: (cm) 2 Width: (cm) 1.2 Depth: (cm)  0.1 Area: (cm) 1.885 Volume: (cm) 0.188 % Reduction in Area: 46.7% % Reduction in Volume: 46.7% Wound Description Full Thickness With Exposed Support Foul Odor Classification: Structures Slough/Fi Wound Margin: Flat and Intact Exudate Small Amount: Exudate Type: Serous Exudate Color: amber After Cleansing: No brino Yes Wound Bed Granulation Amount: Small (1-33%) Exposed Structure Granulation Quality: Red Fascia Exposed: No Necrotic Amount: Large (67-100%) Fat Layer (Subcutaneous Tissue) Exposed: Yes Necrotic Quality: Adherent Slough Tendon Exposed: No Muscle Exposed: No Joint Exposed: No Bone Exposed: No Periwound Skin Texture Hage, Kerry K. ([id number]) Texture Color No Abnormalities Noted: No No Abnormalities Noted: No Callus: No Atrophie Blanche: No Crepitus: No Cyanosis: No Excoriation: Yes Ecchymosis: No Induration: No Erythema: No Rash: No Hemosiderin Staining: No Scarring: No Mottled: No Pallor: No Moisture Rubor: No No Abnormalities Noted: No Dry / Scaly: No Temperature / Pain Maceration: No Temperature: No Abnormality Tenderness on Palpation: Yes Wound Preparation Ulcer Cleansing: Rinsed/Irrigated with Saline Topical Anesthetic Applied: Other: lidociane 4%, Treatment Notes Wound #1 (Left Hand - 3rd Digit) 1. Cleansed with: Clean wound with Normal Saline 2. Anesthetic Topical Lidocaine 4% cream to wound bed prior to debridement 4. Dressing Applied: Prisma Ag 5. Secondary Dressing Applied [place] Notes conform and Government social research officer) Signed: 06/26/2017 12:22:15 PM By: Elliot Gurney, BSN, RN, CWS, Kim RN, BSN Entered By: Elliot Gurney, BSN, RN, CWS, Kim on 06/26/2017 10:11:48 [name] ([id number]) -------------------------------------------------------------------------------- Vitals Details Patient Name: [name] Date of Service: 06/26/2017 10:15 AM Medical Record Number: [id number] Patient Account Number:  [id number] Date of Birth/Sex: 1970-06-26 (47 y.o. Male) Treating RN: Huel Coventry Primary Care Hanad Leino: PATIENT, NO Other Clinician: Referring Ozzy Bohlken: Janalyn Harder Treating Jaiona Simien/Extender: Linwood Dibbles, HOYT Weeks in Treatment: 3 Vital Signs Time Taken: 10:07 Temperature (F): 97.7 Height (in): 76 Pulse (bpm): 74 Weight (lbs): 235 Respiratory Rate (breaths/min): 16 Body Mass Index (BMI): 28.6 Blood Pressure (mmHg): 161/95 Reference Range: 80 - 120 mg / dl Notes Patient instructed to see PCP for follow-up on BP. PA Notified. Electronic Signature(s) Signed: 06/26/2017 12:22:15 PM By: Elliot Gurney, BSN, RN, CWS, Kim RN, BSN Entered By: Elliot Gurney, BSN, RN, CWS, Kim on 06/26/2017 10:08:10

## 2017-06-27 NOTE — Progress Notes (Addendum)
LOWELL, MAKARA ([id number]) Visit Report for 06/26/2017 Chief Complaint Document Details Patient Name: [name], [name]. Date of Service: 06/26/2017 10:15 AM Medical Record Number: [id number] Patient Account Number: [id number] Date of Birth/Sex: 12-Mar-1971 (47 y.o. Male) Treating RN: Curtis Sites Primary Care Provider: PATIENT, NO Other Clinician: Referring Provider: Janalyn Harder Treating Provider/Extender: Linwood Dibbles, HOYT Weeks in Treatment: 3 Information Obtained from: Patient Chief Complaint Patient seen for complaints of Non-Healing Wound to the distal phalanx of the left middle finger which he has had since 05/23/2017 Electronic Signature(s) Signed: 06/26/2017 4:26:52 PM By: Lenda Kelp PA-C Entered By: Lenda Kelp on 06/26/2017 10:16:49 [name] ([id number]) -------------------------------------------------------------------------------- HPI Details Patient Name: [name] Date of Service: 06/26/2017 10:15 AM Medical Record Number: [id number] Patient Account Number: [id number] Date of Birth/Sex: 1971/01/06 (47 y.o. Male) Treating RN: Curtis Sites Primary Care Provider: PATIENT, NO Other Clinician: Referring Provider: Janalyn Harder Treating Provider/Extender: Linwood Dibbles, HOYT Weeks in Treatment: 3 History of Present Illness HPI Description: this 47 year old young man was referred by the [employer] team at [facility] employee health and wellness at [facility] and has a injury to the left middle finger distal phalanx on 05/23/2017. He sustained a crush injury in a grinder and the distal tip of the left middle finger was ripped and torn off including the fingernail. He had an x-ray of the left hand IMPRESSION:1. Middle finger distal soft tissue laceration with loss of soft tissue to the finger tip. Subtle evidence of minimal fracturing of the distal margin of the distal tuft. No other fracture. No dislocation. No radiopaque  foreign body. the patient does not have any significant past medical history but is a smoker and smokes about a pack of cigarettes a day. The patient was prescribed Cefadroxil 500 mg twice a day for 7 days and given some dressing changes and asked to see the wound care. the patient was not referred to hand a Engineer, petroleum. 06/12/17 on evaluation today patient appears to be doing decently well in regard to the injury only distal portion of his left middle finger. He is having some discomfort unfortunately. Nonetheless he has not seen the orthopedic specialist as of yet that we referred him to. He states he really wasn't sure if that was something that was necessary or not and subsequently he also states that he has had a difficult time getting in touch with the case manager and adjuster on his case over the holiday. Nonetheless he has continued to perform the dressing changes appropriately and the wound does seem to be showing signs of improvement which is excellent news. Upon review of the x-ray it does appear that the fracture of the tuft was minimal nonetheless I do think she needs orthopedic specialist to monitor this as far as ensuring complete healing and obviously rate and releasing the patient once everything is said and done. 06/19/17 on evaluation today patient's wound on the tip of his left middle finger appears to be doing much better. The Melburn Popper has been doing its job and a lot of the slough was very loose. At the very tip he did have a little bit of slough that was more adherent. There does not appear to be any infection and he actually has an appointment with orthopedics tomorrow at [facility] for evaluation in regard to the tuft fracture. He seems to still be having some pain from likely this fracture. 06/26/17 on evaluation today patient appears to be doing excellent in regard to  his left middle finger ulcer. He has been tolerating the dressing changes without complication. [facility]. With that being said they did not see any evidence of fracture at this point according to what the patient is telling me although I do not have that note for review personally as of yet. Nonetheless it sounds like things are doing very well and the fact that there is no fracture is even better. Again this had been noted on the initial report. He really does not have any significant slough today on evaluation. Electronic Signature(s) Signed: 06/26/2017 4:26:52 PM By: Lenda Kelp PA-C Entered By: Lenda Kelp on 06/26/2017 10:26:35 [name] ([id number]) -------------------------------------------------------------------------------- Physical Exam Details Patient Name: [name]. Date of Service: 06/26/2017 10:15 AM Medical Record Number: [id number] Patient Account Number: [id number] Date of Birth/Sex: Sep 14, 1970 (47 y.o. Male) Treating RN: Curtis Sites Primary Care Provider: PATIENT, NO Other Clinician: Referring Provider: Janalyn Harder Treating Provider/Extender: STONE III, HOYT Weeks in Treatment: 3 Constitutional Well-nourished and well-hydrated in no acute distress. Respiratory normal breathing without difficulty. clear to auscultation bilaterally. Cardiovascular regular rate and rhythm with normal S1, S2. Psychiatric this patient is able to make decisions and demonstrates good insight into disease process. Alert and Oriented x 3. pleasant and cooperative. Notes Patient's wound bed appears as shown excellent granular surface there is no significant slough noted I was able to just cleans this with saline and gauze without any problem. Patient is having much less discomfort it appears today as well. No debridement required. Electronic Signature(s) Signed: 06/26/2017 4:26:52 PM By: Lenda Kelp PA-C Entered By: Lenda Kelp on 06/26/2017 10:27:20 [name]  ([id number]) -------------------------------------------------------------------------------- Physician Orders Details Patient Name: [name] Date of Service: 06/26/2017 10:15 AM Medical Record Number: [id number] Patient Account Number: [id number] Date of Birth/Sex: 12/03/1970 (47 y.o. Male) Treating RN: Huel Coventry Primary Care Provider: PATIENT, NO Other Clinician: Referring Provider: Janalyn Harder Treating Provider/Extender: Linwood Dibbles, HOYT Weeks in Treatment: 3 Verbal / Phone Orders: No Diagnosis Coding ICD-10 Coding Code Description S61.303A Unspecified open wound of left middle finger with damage to nail, initial encounter S62.663B Nondisplaced fracture of distal phalanx of left middle finger, initial encounter for open fracture F17.218 Nicotine dependence, cigarettes, with other nicotine-induced disorders Wound Cleansing Wound #1 Left Hand - 3rd Digit o Clean wound with Normal Saline. o Cleanse wound with mild soap and water o May Shower, gently pat wound dry prior to applying new dressing. Anesthetic (add to Medication List) Wound #1 Left Hand - 3rd Digit o Topical Lidocaine 4% cream applied to wound bed prior to debridement (In Clinic Only). Primary Wound Dressing Wound #1 Left Hand - 3rd Digit o Prisma Ag - moisten with saline Secondary Dressing Wound #1 Left Hand - 3rd Digit o Conform/Kerlix - non-stick pad o Other - netting #2 Dressing Change Frequency Wound #1 Left Hand - 3rd Digit o Change dressing every day. Follow-up Appointments Wound #1 Left Hand - 3rd Digit o Return Appointment in 1 week. Additional Orders / Instructions Wound #1 Left Hand - 3rd Digit o Stop Smoking o Vitamin A; Vitamin C, Zinc - over the counter supplements o Increase protein intake. Patient Medications STELLA, ENCARNACION ([id number]) Allergies: No Known Allergies Notifications Medication Indication Start End lidocaine DOSE topical 4 % cream - cream  topical Electronic Signature(s) Signed: 06/26/2017 12:22:15 PM By: Elliot Gurney, BSN, RN, CWS, Kim RN, BSN Signed: 06/26/2017 4:26:52 PM By: Larina Bras  III, Hoyt PA-C Entered By: Elliot Gurney, BSN, RN, CWS, Kim on 06/26/2017 10:28:45 DISHAWN, BHARGAVA ([id number]) -------------------------------------------------------------------------------- Prescription 06/26/2017 Patient Name: [name]. Provider: Lenda Kelp PA-C Date of Birth: 1970/07/27 NPI#: [id number] Sex: Judie Petit DEA#: [id number] Phone #: [phone] License #: Patient Address: [facility] [address] [facility] [place], [place] [address] [address], [address] [place], [place] [address] [phone] Allergies No Known Allergies Medication Medication: Route: Strength: Form: lidocaine 4 % topical cream topical 4% cream Class: TOPICAL LOCAL ANESTHETICS Dose: Frequency / Time: Indication: cream topical Number of Refills: Number of Units: 0 Generic Substitution: Start Date: End Date: One Time Use: Substitution Permitted No Note to Pharmacy: Signature(s): Date(s): Electronic Signature(s) Signed: 06/26/2017 12:22:15 PM By: Elliot Gurney, BSN, RN, CWS, Kim RN, BSN Signed: 06/26/2017 4:26:52 PM By: Lenda Kelp PA-C Entered By: Elliot Gurney, BSN, RN, CWS, Kim on 06/26/2017 10:28:46 [name] ([id number]) --------------------------------------------------------------------------------  Problem List Details Patient Name: OLUWATOBI, VISSER. Date of Service: 06/26/2017 10:15 AM Medical Record Number: [id number] Patient Account Number: [id number] Date of Birth/Sex: [birth year] (47 y.o. Male) Treating RN: Curtis Sites Primary Care Provider: PATIENT, NO Other Clinician: Referring Provider: Janalyn Harder Treating Provider/Extender: Linwood Dibbles, HOYT Weeks in Treatment: 3 Active Problems ICD-10 Encounter Code Description Active Date Diagnosis S61.303A Unspecified open  wound of left middle finger with damage to nail, 06/02/2017 Yes initial encounter S62.663B Nondisplaced fracture of distal phalanx of left middle finger, initial 06/02/2017 Yes encounter for open fracture F17.218 Nicotine dependence, cigarettes, with other nicotine-induced 06/02/2017 Yes disorders Inactive Problems Resolved Problems Electronic Signature(s) Signed: 06/26/2017 4:26:52 PM By: Lenda Kelp PA-C Entered By: Lenda Kelp on 06/26/2017 10:16:39 [name] ([id number]) -------------------------------------------------------------------------------- Progress Note Details Patient Name: [name] Date of Service: 06/26/2017 10:15 AM Medical Record Number: [id number] Patient Account Number: [id number] Date of Birth/Sex: [birth year] (47 y.o. Male) Treating RN: Curtis Sites Primary Care Provider: PATIENT, NO Other Clinician: Referring Provider: Janalyn Harder Treating Provider/Extender: Linwood Dibbles, HOYT Weeks in Treatment: 3 Subjective Chief Complaint Information obtained from Patient Patient seen for complaints of Non-Healing Wound to the distal phalanx of the left middle finger which he has had since 05/23/2017 History of Present Illness (HPI) this 47 year old young man was referred by the [employer] team at [facility] employee health and wellness at [facility] and has a injury to the left middle finger distal phalanx on 05/23/2017. He sustained a crush injury in a grinder and the distal tip of the left middle finger was ripped and torn off including the fingernail. He had an x-ray of the left hand IMPRESSION:1. Middle finger distal soft tissue laceration with loss of soft tissue to the finger tip. Subtle evidence of minimal fracturing of the distal margin of the distal tuft. No other fracture. No dislocation. No radiopaque foreign body. the patient does not have any significant past medical history but is a smoker and smokes about a  pack of cigarettes a day. The patient was prescribed Cefadroxil 500 mg twice a day for 7 days and given some dressing changes and asked to see the wound care. the patient was not referred to hand a Engineer, petroleum. 06/12/17 on evaluation today patient appears to be doing decently well in regard to the injury only distal portion of his left middle finger. He is having some discomfort unfortunately. Nonetheless he has not seen the orthopedic specialist as of yet that we referred him to. He states  he really wasn't sure if that was something that was necessary or not and subsequently he also states that he has had a difficult time getting in touch with the case manager and adjuster on his case over the holiday. Nonetheless he has continued to perform the dressing changes appropriately and the wound does seem to be showing signs of improvement which is excellent news. Upon review of the x-ray it does appear that the fracture of the tuft was minimal nonetheless I do think she needs orthopedic specialist to monitor this as far as ensuring complete healing and obviously rate and releasing the patient once everything is said and done. 06/19/17 on evaluation today patient's wound on the tip of his left middle finger appears to be doing much better. The Melburn Popper has been doing its job and a lot of the slough was very loose. At the very tip he did have a little bit of slough that was more adherent. There does not appear to be any infection and he actually has an appointment with orthopedics tomorrow at [facility] for evaluation in regard to the tuft fracture. He seems to still be having some pain from likely this fracture. 06/26/17 on evaluation today patient appears to be doing excellent in regard to his left middle finger ulcer. He has been tolerating the dressing changes without complication. [facility]. With that being said they did not see any evidence of fracture  at this point according to what the patient is telling me although I do not have that note for review personally as of yet. Nonetheless it sounds like things are doing very well and the fact that there is no fracture is even better. Again this had been noted on the initial report. He really does not have any significant slough today on evaluation. Patient History Information obtained from Patient. Family History No family history of Cancer, Diabetes, Heart Disease, Hereditary Spherocytosis, Hypertension, Kidney Disease, Lung Disease, Seizures, Stroke, Thyroid Problems, Tuberculosis. Social History SHERRILL, MCKAMIE ([id number]) Current every day smoker - 1/2 pack to 1 pack, Marital Status - Single, Alcohol Use - Rarely, Drug Use - Current History - marijana occasionally, Caffeine Use - Daily. Review of Systems (ROS) Constitutional Symptoms (General Health) Denies complaints or symptoms of Fever, Chills. Respiratory The patient has no complaints or symptoms. Cardiovascular The patient has no complaints or symptoms. Psychiatric The patient has no complaints or symptoms. Objective Constitutional Well-nourished and well-hydrated in no acute distress. Vitals Time Taken: 10:07 AM, Height: 76 in, Weight: 235 lbs, BMI: 28.6, Temperature: 97.7 F, Pulse: 74 bpm, Respiratory Rate: 16 breaths/min, Blood Pressure: 161/95 mmHg. General Notes: Patient instructed to see PCP for follow-up on BP. PA Notified. Respiratory normal breathing without difficulty. clear to auscultation bilaterally. Cardiovascular regular rate and rhythm with normal S1, S2. Psychiatric this patient is able to make decisions and demonstrates good insight into disease process. Alert and Oriented x 3. pleasant and cooperative. General Notes: Patient's wound bed appears as shown excellent granular surface there is no significant slough noted I was able to just cleans this with saline and gauze without any problem. Patient is  having much less discomfort it appears today as well. No debridement required. Integumentary (Hair, Skin) Wound #1 status is Open. Original cause of wound was Trauma. The wound is located on the Left Hand - 3rd Digit. The wound measures 2cm length x 1.2cm width x 0.1cm depth; 1.885cm^2 area and 0.188cm^3 volume. There is Fat  Layer (Subcutaneous Tissue) Exposed exposed. There is a small amount of serous drainage noted. The wound margin is flat and intact. There is small (1-33%) red granulation within the wound bed. There is a large (67-100%) amount of necrotic tissue within the wound bed including Adherent Slough. The periwound skin appearance exhibited: Excoriation. The periwound skin appearance did not exhibit: Callus, Crepitus, Induration, Rash, Scarring, Dry/Scaly, Maceration, Atrophie Blanche, Cyanosis, Ecchymosis, Hemosiderin Staining, Mottled, Pallor, Rubor, Erythema. Periwound temperature was noted as No Abnormality. The periwound has tenderness on palpation. Hunter ClarksSTACEY, Jaxsyn K. ([id number]) Assessment Active Problems ICD-10 [phone] - Unspecified open wound of left middle finger with damage to nail, initial encounter S62.663B - Nondisplaced fracture of distal phalanx of left middle finger, initial encounter for open fracture F17.218 - Nicotine dependence, cigarettes, with other nicotine-induced disorders Plan Wound Cleansing: Wound #1 Left Hand - 3rd Digit: Clean wound with Normal Saline. Cleanse wound with mild soap and water May Shower, gently pat wound dry prior to applying new dressing. Anesthetic (add to Medication List): Wound #1 Left Hand - 3rd Digit: Topical Lidocaine 4% cream applied to wound bed prior to debridement (In Clinic Only). Primary Wound Dressing: Wound #1 Left Hand - 3rd Digit: Prisma Ag - moisten with saline Secondary Dressing: Wound #1 Left Hand - 3rd Digit: Conform/Kerlix - non-stick pad Other - netting #2 Dressing Change Frequency: Wound #1 Left Hand -  3rd Digit: Change dressing every day. Follow-up Appointments: Wound #1 Left Hand - 3rd Digit: Return Appointment in 1 week. Additional Orders / Instructions: Wound #1 Left Hand - 3rd Digit: Stop Smoking Vitamin A; Vitamin C, Zinc - over the counter supplements Increase protein intake. The following medication(s) was prescribed: lidocaine topical 4 % cream cream topical was prescribed at facility I'm gonna recommend that we continue with the Current wound care measures as patient appears to be doing excellent on evaluation today. He is in agreement with the plan. We will see him for reevaluation in one weeks time. Please see above for specific wound care orders. We will see patient for re-evaluation in 1 week(s) here in the clinic. If anything worsens or changes patient will contact our office for additional recommendations. Hunter ClarksSTACEY, Ethel K. ([id number]) Electronic Signature(s) Signed: 07/10/2017 4:50:01 PM By: Lenda KelpStone III, Hoyt PA-C Previous Signature: 06/26/2017 4:26:52 PM Version By: Lenda KelpStone III, Hoyt PA-C Entered By: Lenda KelpStone III, Hoyt on 07/10/2017 08:58:47 Hunter ClarksSTACEY, Avory K. ([id number]) -------------------------------------------------------------------------------- ROS/PFSH Details Patient Name: Hunter ClarksSTACEY, Boris K. Date of Service: 06/26/2017 10:15 AM Medical Record Number: [id number] Patient Account Number: [id number] Date of Birth/Sex: 03/03/1971 20(46 y.o. Male) Treating RN: Curtis Sitesorthy, Joanna Primary Care Provider: PATIENT, NO Other Clinician: Referring Provider: Janalyn HarderSINGER, SAMANTHA Treating Provider/Extender: Linwood DibblesSTONE III, HOYT Weeks in Treatment: 3 Information Obtained From Patient Wound History Do you currently have one or more open woundso Yes How many open wounds do you currently haveo 1 Approximately how long have you had your woundso 10 days How have you been treating your wound(s) until nowo dry bandage Has your wound(s) ever healed and then re-openedo No Have you had any lab work done  in the past montho No Have you tested positive for an antibiotic resistant organism (MRSA, VRE)o No Have you tested positive for osteomyelitis (bone infection)o No Have you had any tests for circulation on your legso No Constitutional Symptoms (General Health) Complaints and Symptoms: Negative for: Fever; Chills Hematologic/Lymphatic Medical History: Negative for: Anemia; Hemophilia; Human Immunodeficiency Virus; Lymphedema; Sickle Cell Disease Respiratory Complaints and Symptoms: No Complaints or Symptoms Medical History:  Negative for: Aspiration; Asthma; Chronic Obstructive Pulmonary Disease (COPD); Pneumothorax; Sleep Apnea; Tuberculosis Cardiovascular Complaints and Symptoms: No Complaints or Symptoms Medical History: Negative for: Angina; Arrhythmia; Congestive Heart Failure; Coronary Artery Disease; Deep Vein Thrombosis; Hypertension; Hypotension; Myocardial Infarction; Peripheral Arterial Disease; Peripheral Venous Disease; Phlebitis; Vasculitis Gastrointestinal Medical History: Negative for: Cirrhosis ; Colitis; Crohnos; Hepatitis A; Hepatitis B; Hepatitis C Endocrine DUWARD, ALLBRITTON ([id number]) Medical History: Negative for: Type I Diabetes; Type II Diabetes Genitourinary Medical History: Negative for: End Stage Renal Disease Immunological Medical History: Negative for: Lupus Erythematosus; Raynaudos; Scleroderma Integumentary (Skin) Medical History: Negative for: History of Burn; History of pressure wounds Musculoskeletal Medical History: Negative for: Gout; Rheumatoid Arthritis; Osteoarthritis; Osteomyelitis Neurologic Medical History: Negative for: Dementia; Neuropathy; Quadriplegia; Paraplegia; Seizure Disorder Oncologic Medical History: Negative for: Received Chemotherapy; Received Radiation Psychiatric Complaints and Symptoms: No Complaints or Symptoms Medical History: Negative for: Anorexia/bulimia; Confinement Anxiety Immunizations Pneumococcal  Vaccine: Received Pneumococcal Vaccination: Yes Tetanus Vaccine: Last tetanus shot: 05/23/2017 Implantable Devices Family and Social History Cancer: No; Diabetes: No; Heart Disease: No; Hereditary Spherocytosis: No; Hypertension: No; Kidney Disease: No; Lung Disease: No; Seizures: No; Stroke: No; Thyroid Problems: No; Tuberculosis: No; Current every day smoker - 1/2 pack to 1 pack; Marital Status - Single; Alcohol Use: Rarely; Drug Use: Current History - marijana occasionally; Caffeine Use: Daily; Financial Concerns: No; Food, Clothing or Shelter Needs: No; Support System Lacking: No; Transportation Concerns: No; Advanced Directives: No; Patient does not want information on Advanced Directives; Do not resuscitate: No; Living Will: No; Medical Power of Attorney: No Physician Affirmation I have reviewed and agree with the above information. DREQUAN, IRONSIDE ([id number]) Electronic Signature(s) Signed: 06/26/2017 4:26:52 PM By: Lenda Kelp PA-C Signed: 06/26/2017 4:52:11 PM By: Curtis Sites Entered By: Lenda Kelp on 06/26/2017 10:26:59 KERMIT, ARNETTE ([id number]) -------------------------------------------------------------------------------- SuperBill Details Patient Name: [name] Date of Service: 06/26/2017 Medical Record Number: [id number] Patient Account Number: [id number] Date of Birth/Sex: August 16, 1970 (47 y.o. Male) Treating RN: Curtis Sites Primary Care Provider: PATIENT, NO Other Clinician: Referring Provider: Janalyn Harder Treating Provider/Extender: Linwood Dibbles, HOYT Weeks in Treatment: 3 Diagnosis Coding ICD-10 Codes Code Description S61.303A Unspecified open wound of left middle finger with damage to nail, initial encounter S62.663B Nondisplaced fracture of distal phalanx of left middle finger, initial encounter for open fracture F17.218 Nicotine dependence, cigarettes, with other nicotine-induced disorders Facility Procedures CPT4 Code:  [id number] Description: 99213 - WOUND CARE VISIT-LEV 3 EST PT Modifier: Quantity: 1 Physician Procedures CPT4: Description Modifier Quantity Code [id number] 99213 - WC PHYS LEVEL 3 - EST PT 1 ICD-10 Diagnosis Description S61.303A Unspecified open wound of left middle finger with damage to nail, initial encounter S62.663B Nondisplaced fracture of distal phalanx  of left middle finger, initial encounter for open fracture F17.218 Nicotine dependence, cigarettes, with other nicotine-induced disorders Electronic Signature(s) Signed: 06/27/2017 11:47:43 AM By: Francie Massing Signed: 06/28/2017 7:56:31 AM By: Lenda Kelp PA-C Previous Signature: 06/26/2017 10:28:07 AM Version By: Lenda Kelp PA-C Entered By: Francie Massing on 06/27/2017 11:47:19

## 2017-07-03 ENCOUNTER — Encounter: Payer: Worker's Compensation | Admitting: Physician Assistant

## 2017-07-03 DIAGNOSIS — S62663B Nondisplaced fracture of distal phalanx of left middle finger, initial encounter for open fracture: Secondary | ICD-10-CM | POA: Diagnosis not present

## 2017-07-04 NOTE — Progress Notes (Signed)
RACHARD, ISIDRO ([id number]) Visit Report for 07/03/2017 Arrival Information Details Patient Name: DONYALE, BERTHOLD. Date of Service: 07/03/2017 12:30 PM Medical Record Number: [id number] Patient Account Number: [id number] Date of Birth/Sex: August 22, 1970 (47 y.o. Male) Treating RN: Curtis Sites Primary Care Zeplin Aleshire: PATIENT, NO Other Clinician: Referring Kingston Guiles: Janalyn Harder Treating Caryssa Elzey/Extender: Linwood Dibbles, HOYT Weeks in Treatment: 4 Visit Information History Since Last Visit Added or deleted any medications: No Patient Arrived: Ambulatory Any new allergies or adverse reactions: No Arrival Time: 12:44 Had a fall or experienced change in No Accompanied By: self activities of daily living that may affect Transfer Assistance: None risk of falls: Patient Identification Verified: Yes Signs or symptoms of abuse/neglect since last visito No Secondary Verification Process Completed: Yes Hospitalized since last visit: No Has Dressing in Place as Prescribed: Yes Pain Present Now: No Electronic Signature(s) Signed: 07/03/2017 5:01:42 PM By: Curtis Sites Entered By: Curtis Sites on 07/03/2017 12:44:29 [name] ([id number]) -------------------------------------------------------------------------------- Clinic Level of Care Assessment Details Patient Name: [name] Date of Service: 07/03/2017 12:30 PM Medical Record Number: [id number] Patient Account Number: [id number] Date of Birth/Sex: 25-Jul-1970 (47 y.o. Male) Treating RN: Curtis Sites Primary Care Shawntae Lowy: PATIENT, NO Other Clinician: Referring Sherrina Zaugg: Janalyn Harder Treating Emilea Goga/Extender: Linwood Dibbles, HOYT Weeks in Treatment: 4 Clinic Level of Care Assessment Items TOOL 4 Quantity Score []  - Use when only an EandM is performed on FOLLOW-UP visit 0 ASSESSMENTS - Nursing Assessment / Reassessment X - Reassessment of Co-morbidities (includes updates in patient status) 1 10 X- 1 5 Reassessment  of Adherence to Treatment Plan ASSESSMENTS - Wound and Skin Assessment / Reassessment X - Simple Wound Assessment / Reassessment - one wound 1 5 []  - 0 Complex Wound Assessment / Reassessment - multiple wounds []  - 0 Dermatologic / Skin Assessment (not related to wound area) ASSESSMENTS - Focused Assessment []  - Circumferential Edema Measurements - multi extremities 0 []  - 0 Nutritional Assessment / Counseling / Intervention []  - 0 Lower Extremity Assessment (monofilament, tuning fork, pulses) []  - 0 Peripheral Arterial Disease Assessment (using hand held doppler) ASSESSMENTS - Ostomy and/or Continence Assessment and Care []  - Incontinence Assessment and Management 0 []  - 0 Ostomy Care Assessment and Management (repouching, etc.) PROCESS - Coordination of Care X - Simple Patient / Family Education for ongoing care 1 15 []  - 0 Complex (extensive) Patient / Family Education for ongoing care []  - 0 Staff obtains Chiropractor, Records, Test Results / Process Orders []  - 0 Staff telephones HHA, Nursing Homes / Clarify orders / etc []  - 0 Routine Transfer to another Facility (non-emergent condition) []  - 0 Routine Hospital Admission (non-emergent condition) []  - 0 New Admissions / Manufacturing engineer / Ordering NPWT, Apligraf, etc. []  - 0 Emergency Hospital Admission (emergent condition) X- 1 10 Simple Discharge Coordination DOY, TAAFFE ([id number]) []  - 0 Complex (extensive) Discharge Coordination PROCESS - Special Needs []  - Pediatric / Minor Patient Management 0 []  - 0 Isolation Patient Management []  - 0 Hearing / Language / Visual special needs []  - 0 Assessment of Community assistance (transportation, D/C planning, etc.) []  - 0 Additional assistance / Altered mentation []  - 0 Support Surface(s) Assessment (bed, cushion, seat, etc.) INTERVENTIONS - Wound Cleansing / Measurement X - Simple Wound Cleansing - one wound 1 5 []  - 0 Complex Wound Cleansing -  multiple wounds X- 1 5 Wound Imaging (photographs - any number of wounds) []  - 0 Wound Tracing (instead of photographs) X- 1 5 Simple Wound  Measurement - one wound []  - 0 Complex Wound Measurement - multiple wounds INTERVENTIONS - Wound Dressings X - Small Wound Dressing one or multiple wounds 1 10 []  - 0 Medium Wound Dressing one or multiple wounds []  - 0 Large Wound Dressing one or multiple wounds []  - 0 Application of Medications - topical []  - 0 Application of Medications - injection INTERVENTIONS - Miscellaneous []  - External ear exam 0 []  - 0 Specimen Collection (cultures, biopsies, blood, body fluids, etc.) []  - 0 Specimen(s) / Culture(s) sent or taken to Lab for analysis []  - 0 Patient Transfer (multiple staff / Michiel SitesHoyer Lift / Similar devices) []  - 0 Simple Staple / Suture removal (25 or less) []  - 0 Complex Staple / Suture removal (26 or more) []  - 0 Hypo / Hyperglycemic Management (close monitor of Blood Glucose) []  - 0 Ankle / Brachial Index (ABI) - do not check if billed separately X- 1 5 Vital Signs Crossno, Janari K. ([id number]) Has the patient been seen at the hospital within the last three years: Yes Total Score: 75 Level Of Care: New/Established - Level 2 Electronic Signature(s) Signed: 07/03/2017 5:01:42 PM By: Curtis Sitesorthy, Joanna Entered By: Curtis Sitesorthy, Joanna on 07/03/2017 15:45:02 Ludwig ClarksSTACEY, Markeem K. ([id number]) -------------------------------------------------------------------------------- Encounter Discharge Information Details Patient Name: Ludwig ClarksSTACEY, Mclain K. Date of Service: 07/03/2017 12:30 PM Medical Record Number: [id number] Patient Account Number: [id number] Date of Birth/Sex: 10/03/1970 [age] y.o. Male) Treating RN: Curtis Sitesorthy, Joanna Primary Care Esaul Dorwart: PATIENT, NO Other Clinician: Referring Lucas Exline: Janalyn HarderSINGER, SAMANTHA Treating Enolia Koepke/Extender: Linwood DibblesSTONE III, HOYT Weeks in Treatment: 4 Encounter Discharge Information Items Discharge Pain Level:  0 Discharge Condition: Stable Ambulatory Status: Ambulatory Discharge Destination: Home Transportation: Private Auto Accompanied By: self Schedule Follow-up Appointment: Yes Medication Reconciliation completed and No provided to Patient/Care Deep [place]: Provided on Clinical Summary of Care: 07/03/2017 Form Type Recipient Paper Patient TS Electronic Signature(s) Signed: 07/03/2017 3:52:48 PM By: Curtis Sitesorthy, Joanna Entered By: Curtis Sitesorthy, Joanna on 07/03/2017 15:52:48 Ludwig ClarksSTACEY, Rodolphe K. ([id number]) -------------------------------------------------------------------------------- Multi Wound Chart Details Patient Name: Ludwig ClarksSTACEY, Sameer K. Date of Service: 07/03/2017 12:30 PM Medical Record Number: [id number] Patient Account Number: [id number] Date of Birth/Sex: 12/20/1970 [age] y.o. Male) Treating RN: Curtis Sitesorthy, Joanna Primary Care Milferd Ansell: PATIENT, NO Other Clinician: Referring Kalya Troeger: Janalyn HarderSINGER, SAMANTHA Treating Hanley Woerner/Extender: STONE III, HOYT Weeks in Treatment: 4 Vital Signs Height(in): 76 Pulse(bpm): 69 Weight(lbs): 235 Blood Pressure(mmHg): 169/90 Body Mass Index(BMI): 29 Temperature(F): Respiratory Rate 16 (breaths/min): Photos: [1:No Photos] [N/A:N/A] Wound Location: [1:Left Hand - 3rd Digit] [N/A:N/A] Wounding Event: [1:Trauma] [N/A:N/A] Primary Etiology: [1:Trauma, Other] [N/A:N/A] Date Acquired: [1:05/23/2017] [N/A:N/A] Weeks of Treatment: [1:4] [N/A:N/A] Wound Status: [1:Open] [N/A:N/A] Pending Amputation on [1:Yes] [N/A:N/A] Presentation: Measurements L x W x D [1:1.2x0.6x0.1] [N/A:N/A] (cm) Area (cm) : [1:0.565] [N/A:N/A] Volume (cm) : [1:0.057] [N/A:N/A] % Reduction in Area: [1:84.00%] [N/A:N/A] % Reduction in Volume: [1:83.90%] [N/A:N/A] Classification: [1:Full Thickness With Exposed Support Structures] [N/A:N/A] Exudate Amount: [1:Small] [N/A:N/A] Exudate Type: [1:Serous] [N/A:N/A] Exudate Color: [1:amber] [N/A:N/A] Wound Margin: [1:Flat and Intact]  [N/A:N/A] Granulation Amount: [1:Large (67-100%)] [N/A:N/A] Granulation Quality: [1:Red] [N/A:N/A] Necrotic Amount: [1:Small (1-33%)] [N/A:N/A] Exposed Structures: [1:Fat Layer (Subcutaneous Tissue) Exposed: Yes Fascia: No Tendon: No Muscle: No Joint: No Bone: No] [N/A:N/A] Epithelialization: [1:Small (1-33%)] [N/A:N/A] Periwound Skin Texture: [1:Excoriation: Yes Induration: No Callus: No Crepitus: No] [N/A:N/A] Rash: No Scarring: No Periwound Skin Moisture: Maceration: No N/A N/A Dry/Scaly: No Periwound Skin Color: Atrophie Blanche: No N/A N/A Cyanosis: No Ecchymosis: No Erythema: No Hemosiderin Staining: No Mottled: No Pallor: No Rubor: No Temperature: No Abnormality N/A N/A  Tenderness on Palpation: Yes N/A N/A Wound Preparation: Ulcer Cleansing: N/A N/A Rinsed/Irrigated with Saline Topical Anesthetic Applied: Other: lidociane 4% Treatment Notes Electronic Signature(s) Signed: 07/03/2017 5:01:42 PM By: Curtis Sites Entered By: Curtis Sites on 07/03/2017 13:03:04 [name] ([id number]) -------------------------------------------------------------------------------- Multi-Disciplinary Care Plan Details Patient Name: ADLEY, MAZUROWSKI. Date of Service: 07/03/2017 12:30 PM Medical Record Number: [id number] Patient Account Number: [id number] Date of Birth/Sex: 05-18-1971 (47 y.o. Male) Treating RN: Curtis Sites Primary Care Nyaja [place]: PATIENT, NO Other Clinician: Referring Presley Summerlin: Janalyn Harder Treating Hunt Zajicek/Extender: Linwood Dibbles, HOYT Weeks in Treatment: 4 Active Inactive ` Orientation to the Wound Care Program Nursing Diagnoses: Knowledge deficit related to the wound healing center program Goals: Patient/caregiver will verbalize understanding of the Wound Healing Center Program Date Initiated: 06/02/2017 Target Resolution Date: 08/12/2017 Goal Status: Active Interventions: Provide education on orientation to the wound center Notes: ` Wound/Skin  Impairment Nursing Diagnoses: Impaired tissue integrity Goals: Ulcer/skin breakdown will heal within 14 weeks Date Initiated: 06/02/2017 Target Resolution Date: 08/12/2017 Goal Status: Active Interventions: Assess patient/caregiver ability to obtain necessary supplies Assess patient/caregiver ability to perform ulcer/skin care regimen upon admission and as needed Assess ulceration(s) every visit Notes: Electronic Signature(s) Signed: 07/03/2017 5:01:42 PM By: Curtis Sites Entered By: Curtis Sites on 07/03/2017 13:02:45 [name] ([id number]) -------------------------------------------------------------------------------- Pain Assessment Details Patient Name: [name]. Date of Service: 07/03/2017 12:30 PM Medical Record Number: [id number] Patient Account Number: [id number] Date of Birth/Sex: 06/24/1970 (47 y.o. Male) Treating RN: Curtis Sites Primary Care Varian Innes: PATIENT, NO Other Clinician: Referring Jaque Dacy: Janalyn Harder Treating Jaziyah Gradel/Extender: Linwood Dibbles, HOYT Weeks in Treatment: 4 Active Problems Location of Pain Severity and Description of Pain Patient Has Paino No Site Locations Pain Management and Medication Current Pain Management: Electronic Signature(s) Signed: 07/03/2017 5:01:42 PM By: Curtis Sites Entered By: Curtis Sites on 07/03/2017 12:45:49 [name] ([id number]) -------------------------------------------------------------------------------- Patient/Caregiver Education Details Patient Name: [name] Date of Service: 07/03/2017 12:30 PM Medical Record Number: [id number] Patient Account Number: [id number] Date of Birth/Gender: [birth year] (47 y.o. Male) Treating RN: Curtis Sites Primary Care Physician: PATIENT, NO Other Clinician: Referring Physician: Janalyn Harder Treating Physician/Extender: Skeet Simmer in Treatment: 4 Education Assessment Education Provided To: Patient Education Topics  Provided Wound/Skin Impairment: Handouts: Other: wound care to continue as ordered Methods: Demonstration, Explain/Verbal Responses: State content correctly Electronic Signature(s) Signed: 07/03/2017 5:01:42 PM By: Curtis Sites Entered By: Curtis Sites on 07/03/2017 15:53:05 [name] ([id number]) -------------------------------------------------------------------------------- Wound Assessment Details Patient Name: [name]. Date of Service: 07/03/2017 12:30 PM Medical Record Number: [id number] Patient Account Number: [id number] Date of Birth/Sex: [birth year] (47 y.o. Male) Treating RN: Curtis Sites Primary Care Caelin Rayl: PATIENT, NO Other Clinician: Referring Miosotis Wetsel: Janalyn Harder Treating Ninfa Giannelli/Extender: STONE III, HOYT Weeks in Treatment: 4 Wound Status Wound Number: 1 Primary Etiology: Trauma, Other Wound Location: Left Hand - 3rd Digit Wound Status: Open Wounding Event: Trauma Date Acquired: 05/23/2017 Weeks Of Treatment: 4 Clustered Wound: No Pending Amputation On Presentation Photos Photo Uploaded By: Curtis Sites on 07/03/2017 13:24:54 Wound Measurements Length: (cm) 1.2 Width: (cm) 0.6 Depth: (cm) 0.1 Area: (cm) 0.565 Volume: (cm) 0.057 % Reduction in Area: 84% % Reduction in Volume: 83.9% Epithelialization: Small (1-33%) Tunneling: No Undermining: No Wound Description Full Thickness With Exposed Support Classification: Structures Wound Margin: Flat and Intact Exudate Small Amount: Exudate Type: Serous Exudate Color: amber Foul Odor After Cleansing: No Slough/Fibrino Yes Wound Bed Granulation Amount: Large (67-100%) Exposed Structure Granulation Quality: Red Fascia Exposed: No Necrotic Amount: Small (  1-33%) Fat Layer (Subcutaneous Tissue) Exposed: Yes Necrotic Quality: Adherent Slough Tendon Exposed: No Muscle Exposed: No Joint Exposed: No KEANEN, DOHSE. ([id number]) Bone Exposed: No Periwound Skin  Texture Texture Color No Abnormalities Noted: No No Abnormalities Noted: No Callus: No Atrophie Blanche: No Crepitus: No Cyanosis: No Excoriation: Yes Ecchymosis: No Induration: No Erythema: No Rash: No Hemosiderin Staining: No Scarring: No Mottled: No Pallor: No Moisture Rubor: No No Abnormalities Noted: No Dry / Scaly: No Temperature / Pain Maceration: No Temperature: No Abnormality Tenderness on Palpation: Yes Wound Preparation Ulcer Cleansing: Rinsed/Irrigated with Saline Topical Anesthetic Applied: Other: lidociane 4%, Treatment Notes Wound #1 (Left Hand - 3rd Digit) 1. Cleansed with: Clean wound with Normal Saline 4. Dressing Applied: Prisma Ag 5. Secondary Dressing Applied Kerlix/Conform Non-Adherent pad Notes conform and netting Electronic Signature(s) Signed: 07/03/2017 5:01:42 PM By: Curtis Sites Entered By: Curtis Sites on 07/03/2017 12:49:38 [name] ([id number]) -------------------------------------------------------------------------------- Vitals Details Patient Name: [name] Date of Service: 07/03/2017 12:30 PM Medical Record Number: [id number] Patient Account Number: [id number] Date of Birth/Sex: 12/24/1970 (47 y.o. Male) Treating RN: Curtis Sites Primary Care Nahun Kronberg: PATIENT, NO Other Clinician: Referring Blake Vetrano: Janalyn Harder Treating Deyra Perdomo/Extender: STONE III, HOYT Weeks in Treatment: 4 Vital Signs Time Taken: 12:45 Pulse (bpm): 69 Height (in): 76 Respiratory Rate (breaths/min): 16 Weight (lbs): 235 Blood Pressure (mmHg): 169/90 Body Mass Index (BMI): 28.6 Reference Range: 80 - 120 mg / dl Electronic Signature(s) Signed: 07/03/2017 5:01:42 PM By: Curtis Sites Entered By: Curtis Sites on 07/03/2017 12:46:34

## 2017-07-04 NOTE — Progress Notes (Signed)
COADY, TRAIN ([id number]) Visit Report for 07/03/2017 Chief Complaint Document Details Patient Name: [name], [name]. Date of Service: 07/03/2017 12:30 PM Medical Record Number: [id number] Patient Account Number: [id number] Date of Birth/Sex: [birth year] (47 y.o. Male) Treating RN: Curtis Sites Primary Care Provider: PATIENT, NO Other Clinician: Referring Provider: Janalyn Harder Treating Provider/Extender: Linwood Dibbles, Yosgart Pavey Weeks in Treatment: 4 Information Obtained from: Patient Chief Complaint Patient seen for complaints of Non-Healing Wound to the distal phalanx of the left middle finger which he has had since 05/23/2017 Electronic Signature(s) Signed: 07/03/2017 1:29:11 PM By: Lenda Kelp PA-C Entered By: Lenda Kelp on 07/03/2017 13:01:44 [name] ([id number]) -------------------------------------------------------------------------------- HPI Details Patient Name: [name] Date of Service: 07/03/2017 12:30 PM Medical Record Number: [id number] Patient Account Number: [id number] Date of Birth/Sex: [birth year] (47 y.o. Male) Treating RN: Curtis Sites Primary Care Provider: PATIENT, NO Other Clinician: Referring Provider: Janalyn Harder Treating Provider/Extender: Linwood Dibbles, Arnetta Odeh Weeks in Treatment: 4 History of Present Illness HPI Description: this 47 year old young man was referred by the [employer] team at [facility] employee health and wellness at [facility] and has a injury to the left middle finger distal phalanx on 05/23/2017. He sustained a crush injury in a grinder and the distal tip of the left middle finger was ripped and torn off including the fingernail. He had an x-ray of the left hand IMPRESSION:1. Middle finger distal soft tissue laceration with loss of soft tissue to the finger tip. Subtle evidence of minimal fracturing of the distal margin of the distal tuft. No other fracture. No dislocation. No radiopaque  foreign body. the patient does not have any significant past medical history but is a smoker and smokes about a pack of cigarettes a day. The patient was prescribed Cefadroxil 500 mg twice a day for 7 days and given some dressing changes and asked to see the wound care. the patient was not referred to hand a Engineer, petroleum. 06/12/17 on evaluation today patient appears to be doing decently well in regard to the injury only distal portion of his left middle finger. He is having some discomfort unfortunately. Nonetheless he has not seen the orthopedic specialist as of yet that we referred him to. He states he really wasn't sure if that was something that was necessary or not and subsequently he also states that he has had a difficult time getting in touch with the case manager and adjuster on his case over the holiday. Nonetheless he has continued to perform the dressing changes appropriately and the wound does seem to be showing signs of improvement which is excellent news. Upon review of the x-ray it does appear that the fracture of the tuft was minimal nonetheless I do think she needs orthopedic specialist to monitor this as far as ensuring complete healing and obviously rate and releasing the patient once everything is said and done. 06/19/17 on evaluation today patient's wound on the tip of his left middle finger appears to be doing much better. The Melburn Popper has been doing its job and a lot of the slough was very loose. At the very tip he did have a little bit of slough that was more adherent. There does not appear to be any infection and he actually has an appointment with orthopedics tomorrow at [facility] for evaluation in regard to the tuft fracture. He seems to still be having some pain from likely this fracture. 06/26/17 on evaluation today patient appears to be doing excellent in regard to  his left middle finger ulcer. He has been tolerating the dressing changes without complication. He [facility] the [facility]ft fracture. With that being said they did not see any evidence of fracture at this point according to what the patient is telling me although I do not have that note for review personally as of yet. Nonetheless it sounds like things are doing very well and the fact that there is no fracture is even better. Again this had been noted on the initial report. He really does not have any significant slough today on evaluation. 07/03/17 on evaluation today patient appears to be doing excellent in regard to his finger ulcer. He has been tolerating the dressing changes without complication and continues to have greater and greater epithelialization week by week. Overall I'm very pleased with the progress that he has made. Unfortunately we still have not gotten the report from the orthopedic specialist that he saw. We did request this last week. Electronic Signature(s) Signed: 07/03/2017 1:29:11 PM By: Lenda KelpStone III, Simi Briel PA-C Entered By: Lenda KelpStone III, Byan Poplaski on 07/03/2017 13:15:51 Hunter ClarksSTACEY, Hunter K. ([id number]) -------------------------------------------------------------------------------- Physical Exam Details Patient Name: Hunter ClarksSTACEY, Hunter K. Date of Service: 07/03/2017 12:30 PM Medical Record Number: [id number] Patient Account Number: [id number] Date of Birth/Sex: 11/19/1970 79(46 y.o. Male) Treating RN: Curtis Sitesorthy, Joanna Primary Care Provider: PATIENT, NO Other Clinician: Referring Provider: Janalyn HarderSINGER, SAMANTHA Treating Provider/Extender: STONE III, Annice Jolly Weeks in Treatment: 4 Constitutional Well-nourished and well-hydrated in no acute distress. Respiratory normal breathing without difficulty. clear to auscultation bilaterally. Cardiovascular regular rate and rhythm with normal S1, S2. Psychiatric this patient is able to make decisions and demonstrates good insight into disease process. Alert and Oriented x 3. pleasant and cooperative. Notes Patient did not require any  debridement today as the slough was easily removed with saline and gauze mechanically at this point. He had no significant discomfort with cleansing of the wound. Electronic Signature(s) Signed: 07/03/2017 1:29:11 PM By: Lenda KelpStone III, Cathey Fredenburg PA-C Entered By: Lenda KelpStone III, Anaira Seay on 07/03/2017 13:16:31 Hunter ClarksSTACEY, Hunter K. ([id number]) -------------------------------------------------------------------------------- Physician Orders Details Patient Name: Hunter ClarksSTACEY, Hunter K. Date of Service: 07/03/2017 12:30 PM Medical Record Number: [id number] Patient Account Number: [id number] Date of Birth/Sex: 04/16/1971 [age] y.o. Male) Treating RN: Curtis Sitesorthy, Joanna Primary Care Provider: PATIENT, NO Other Clinician: Referring Provider: Janalyn HarderSINGER, SAMANTHA Treating Provider/Extender: Linwood DibblesSTONE III, Ayiana Winslett Weeks in Treatment: 4 Verbal / Phone Orders: No Diagnosis Coding ICD-10 Coding Code Description S61.303A Unspecified open wound of left middle finger with damage to nail, initial encounter S62.663B Nondisplaced fracture of distal phalanx of left middle finger, initial encounter for open fracture F17.218 Nicotine dependence, cigarettes, with other nicotine-induced disorders Wound Cleansing Wound #1 Left Hand - 3rd Digit o Clean wound with Normal Saline. o Cleanse wound with mild soap and water o May Shower, gently pat wound dry prior to applying new dressing. Anesthetic (add to Medication List) Wound #1 Left Hand - 3rd Digit o Topical Lidocaine 4% cream applied to wound bed prior to debridement (In Clinic Only). Primary Wound Dressing Wound #1 Left Hand - 3rd Digit o Prisma Ag - moisten with saline Secondary Dressing Wound #1 Left Hand - 3rd Digit o Conform/Kerlix - non-stick pad o Other - netting #2 Dressing Change Frequency Wound #1 Left Hand - 3rd Digit o Change dressing every day. Follow-up Appointments Wound #1 Left Hand - 3rd Digit o Return Appointment in 1 week. Additional Orders /  Instructions Wound #1 Left Hand - 3rd Digit o Stop Smoking o Vitamin A;  Vitamin C, Zinc - over the counter supplements o Increase protein intake. Maxwell, Hunter ([id number]) Electronic Signature(s) Signed: 07/03/2017 1:29:11 PM By: Lenda Kelp PA-C Signed: 07/03/2017 5:01:42 PM By: Curtis Sites Entered By: Curtis Sites on 07/03/2017 13:06:00 [name] ([id number]) -------------------------------------------------------------------------------- Problem List Details Patient Name: Maxwell, [name]. Date of Service: 07/03/2017 12:30 PM Medical Record Number: [id number] Patient Account Number: [id number] Date of Birth/Sex: 12-Mar-1971 (47 y.o. Male) Treating RN: Curtis Sites Primary Care Provider: PATIENT, NO Other Clinician: Referring Provider: Janalyn Harder Treating Provider/Extender: Linwood Dibbles, Hawke Villalpando Weeks in Treatment: 4 Active Problems ICD-10 Encounter Code Description Active Date Diagnosis S61.303A Unspecified open wound of left middle finger with damage to nail, 06/02/2017 Yes initial encounter S62.663B Nondisplaced fracture of distal phalanx of left middle finger, initial 06/02/2017 Yes encounter for open fracture F17.218 Nicotine dependence, cigarettes, with other nicotine-induced 06/02/2017 Yes disorders Inactive Problems Resolved Problems Electronic Signature(s) Signed: 07/03/2017 1:29:11 PM By: Lenda Kelp PA-C Entered By: Lenda Kelp on 07/03/2017 13:01:34 [name] ([id number]) -------------------------------------------------------------------------------- Progress Note Details Patient Name: [name] Date of Service: 07/03/2017 12:30 PM Medical Record Number: [id number] Patient Account Number: [id number] Date of Birth/Sex: Nov 09, 1970 (47 y.o. Male) Treating RN: Curtis Sites Primary Care Provider: PATIENT, NO Other Clinician: Referring Provider: Janalyn Harder Treating Provider/Extender: Linwood Dibbles, Sena Hoopingarner Weeks in  Treatment: 4 Subjective Chief Complaint Information obtained from Patient Patient seen for complaints of Non-Healing Wound to the distal phalanx of the left middle finger which he has had since 05/23/2017 History of Present Illness (HPI) this 47 year old young man was referred by the [employer] team at [facility] employee health and wellness at [facility] and has a injury to the left middle finger distal phalanx on 05/23/2017. He sustained a crush injury in a grinder and the distal tip of the left middle finger was ripped and torn off including the fingernail. He had an x-ray of the left hand IMPRESSION:1. Middle finger distal soft tissue laceration with loss of soft tissue to the finger tip. Subtle evidence of minimal fracturing of the distal margin of the distal tuft. No other fracture. No dislocation. No radiopaque foreign body. the patient does not have any significant past medical history but is a smoker and smokes about a pack of cigarettes a day. The patient was prescribed Cefadroxil 500 mg twice a day for 7 days and given some dressing changes and asked to see the wound care. the patient was not referred to hand a Engineer, petroleum. 06/12/17 on evaluation today patient appears to be doing decently well in regard to the injury only distal portion of his left middle finger. He is having some discomfort unfortunately. Nonetheless he has not seen the orthopedic specialist as of yet that we referred him to. He states he really wasn't sure if that was something that was necessary or not and subsequently he also states that he has had a difficult time getting in touch with the case manager and adjuster on his case over the holiday. Nonetheless he has continued to perform the dressing changes appropriately and the wound does seem to be showing signs of improvement which is excellent news. Upon review of the x-ray it does appear that the fracture of the tuft was  minimal nonetheless I do think she needs orthopedic specialist to monitor this as far as ensuring complete healing and obviously rate and releasing the patient once everything is said and done. 06/19/17 on evaluation today patient's wound on the tip  of his left middle finger appears to be doing much better. The Melburn Popper has been doing its job and a lot of the slough was very loose. At the very tip he did have a little bit of slough that was more adherent. There does not appear to be any infection and he actually has an appointment with orthopedics tomorrow at [facility] for evaluation in regard to the tuft fracture. He seems to still be having some pain from likely this fracture. 06/26/17 on evaluation today patient appears to be doing excellent in regard to his left middle finger ulcer. He has been tolerating the dressing changes without complication. [facility]ft fracture. With that being said they did not see any evidence of fracture at this point according to what the patient is telling me although I do not have that note for review personally as of yet. Nonetheless it sounds like things are doing very well and the fact that there is no fracture is even better. Again this had been noted on the initial report. He really does not have any significant slough today on evaluation. 07/03/17 on evaluation today patient appears to be doing excellent in regard to his finger ulcer. He has been tolerating the dressing changes without complication and continues to have greater and greater epithelialization week by week. Overall I'm very pleased with the progress that he has made. Unfortunately we still have not gotten the report from the orthopedic specialist that he saw. We did request this last week. Patient History Information obtained from Patient. DEUNDRE, Hunter ([id number]) Family History No family history of Cancer, Diabetes, Heart Disease, Hereditary  Spherocytosis, Hypertension, Kidney Disease, Lung Disease, Seizures, Stroke, Thyroid Problems, Tuberculosis. Social History Current every day smoker - 1/2 pack to 1 pack, Marital Status - Single, Alcohol Use - Rarely, Drug Use - Current History - marijana occasionally, Caffeine Use - Daily. Review of Systems (ROS) Constitutional Symptoms (General Health) Denies complaints or symptoms of Fever, Chills. Respiratory The patient has no complaints or symptoms. Cardiovascular The patient has no complaints or symptoms. Psychiatric The patient has no complaints or symptoms. Objective Constitutional Well-nourished and well-hydrated in no acute distress. Vitals Time Taken: 12:45 PM, Height: 76 in, Weight: 235 lbs, BMI: 28.6, Pulse: 69 bpm, Respiratory Rate: 16 breaths/min, Blood Pressure: 169/90 mmHg. Respiratory normal breathing without difficulty. clear to auscultation bilaterally. Cardiovascular regular rate and rhythm with normal S1, S2. Psychiatric this patient is able to make decisions and demonstrates good insight into disease process. Alert and Oriented x 3. pleasant and cooperative. General Notes: Patient did not require any debridement today as the slough was easily removed with saline and gauze mechanically at this point. He had no significant discomfort with cleansing of the wound. Integumentary (Hair, Skin) Wound #1 status is Open. Original cause of wound was Trauma. The wound is located on the Left Hand - 3rd Digit. The wound measures 1.2cm length x 0.6cm width x 0.1cm depth; 0.565cm^2 area and 0.057cm^3 volume. There is Fat Layer (Subcutaneous Tissue) Exposed exposed. There is no tunneling or undermining noted. There is a small amount of serous drainage noted. The wound margin is flat and intact. There is large (67-100%) red granulation within the wound bed. There is a small (1-33%) amount of necrotic tissue within the wound bed including Adherent Slough. The periwound skin  appearance exhibited: Excoriation. The periwound skin appearance did not exhibit: Callus, Crepitus, Induration, Rash, Scarring, Dry/Scaly, Maceration, Atrophie Blanche, Cyanosis, Ecchymosis, Hemosiderin Staining,  Mottled, Pallor, Rubor, Erythema. Periwound temperature was noted as No Abnormality. The periwound has tenderness on palpation. VIRAL, SCHRAMM ([id number]) Assessment Active Problems ICD-10 [phone] - Unspecified open wound of left middle finger with damage to nail, initial encounter S62.663B - Nondisplaced fracture of distal phalanx of left middle finger, initial encounter for open fracture F17.218 - Nicotine dependence, cigarettes, with other nicotine-induced disorders Plan Wound Cleansing: Wound #1 Left Hand - 3rd Digit: Clean wound with Normal Saline. Cleanse wound with mild soap and water May Shower, gently pat wound dry prior to applying new dressing. Anesthetic (add to Medication List): Wound #1 Left Hand - 3rd Digit: Topical Lidocaine 4% cream applied to wound bed prior to debridement (In Clinic Only). Primary Wound Dressing: Wound #1 Left Hand - 3rd Digit: Prisma Ag - moisten with saline Secondary Dressing: Wound #1 Left Hand - 3rd Digit: Conform/Kerlix - non-stick pad Other - netting #2 Dressing Change Frequency: Wound #1 Left Hand - 3rd Digit: Change dressing every day. Follow-up Appointments: Wound #1 Left Hand - 3rd Digit: Return Appointment in 1 week. Additional Orders / Instructions: Wound #1 Left Hand - 3rd Digit: Stop Smoking Vitamin A; Vitamin C, Zinc - over the counter supplements Increase protein intake. At this time I'm going to recommend that we continue with the Current wound care measures as patient appears to be doing so well with this. He's in agreement with the plan. We will see him for reevaluation in one weeks time to see were things stand at that point. Please see above for specific wound care orders. We will see patient for  re-evaluation in 1 week(s) here in the clinic. If anything worsens or changes patient will contact our office for additional recommendations. [name], [name] ([id number]) Electronic Signature(s) Signed: 07/03/2017 1:29:11 PM By: Lenda Kelp PA-C Entered By: Lenda Kelp on 07/03/2017 13:16:49 [name], [name] ([id number]) -------------------------------------------------------------------------------- ROS/PFSH Details Patient Name: [name] Date of Service: 07/03/2017 12:30 PM Medical Record Number: [id number] Patient Account Number: [id number] Date of Birth/Sex: 08-Oct-1970 (47 y.o. Male) Treating RN: Curtis Sites Primary Care Provider: PATIENT, NO Other Clinician: Referring Provider: Janalyn Harder Treating Provider/Extender: Linwood Dibbles, Lamar Naef Weeks in Treatment: 4 Information Obtained From Patient Wound History Do you currently have one or more open woundso Yes How many open wounds do you currently haveo 1 Approximately how long have you had your woundso 10 days How have you been treating your wound(s) until nowo dry bandage Has your wound(s) ever healed and then re-openedo No Have you had any lab work done in the past montho No Have you tested positive for an antibiotic resistant organism (MRSA, VRE)o No Have you tested positive for osteomyelitis (bone infection)o No Have you had any tests for circulation on your legso No Constitutional Symptoms (General Health) Complaints and Symptoms: Negative for: Fever; Chills Hematologic/Lymphatic Medical History: Negative for: Anemia; Hemophilia; Human Immunodeficiency Virus; Lymphedema; Sickle Cell Disease Respiratory Complaints and Symptoms: No Complaints or Symptoms Medical History: Negative for: Aspiration; Asthma; Chronic Obstructive Pulmonary Disease (COPD); Pneumothorax; Sleep Apnea; Tuberculosis Cardiovascular Complaints and Symptoms: No Complaints or Symptoms Medical History: Negative for: Angina;  Arrhythmia; Congestive Heart Failure; Coronary Artery Disease; Deep Vein Thrombosis; Hypertension; Hypotension; Myocardial Infarction; Peripheral Arterial Disease; Peripheral Venous Disease; Phlebitis; Vasculitis Gastrointestinal Medical History: Negative for: Cirrhosis ; Colitis; Crohnos; Hepatitis A; Hepatitis B; Hepatitis C Endocrine Maxwell, Hunter ([id number]) Medical History: Negative for: Type I Diabetes; Type II Diabetes Genitourinary Medical History: Negative for: End Stage Renal Disease Immunological Medical  History: Negative for: Lupus Erythematosus; Raynaudos; Scleroderma Integumentary (Skin) Medical History: Negative for: History of Burn; History of pressure wounds Musculoskeletal Medical History: Negative for: Gout; Rheumatoid Arthritis; Osteoarthritis; Osteomyelitis Neurologic Medical History: Negative for: Dementia; Neuropathy; Quadriplegia; Paraplegia; Seizure Disorder Oncologic Medical History: Negative for: Received Chemotherapy; Received Radiation Psychiatric Complaints and Symptoms: No Complaints or Symptoms Medical History: Negative for: Anorexia/bulimia; Confinement Anxiety Immunizations Pneumococcal Vaccine: Received Pneumococcal Vaccination: Yes Tetanus Vaccine: Last tetanus shot: 05/23/2017 Implantable Devices Family and Social History Cancer: No; Diabetes: No; Heart Disease: No; Hereditary Spherocytosis: No; Hypertension: No; Kidney Disease: No; Lung Disease: No; Seizures: No; Stroke: No; Thyroid Problems: No; Tuberculosis: No; Current every day smoker - 1/2 pack to 1 pack; Marital Status - Single; Alcohol Use: Rarely; Drug Use: Current History - marijana occasionally; Caffeine Use: Daily; Financial Concerns: No; Food, Clothing or Shelter Needs: No; Support System Lacking: No; Transportation Concerns: No; Advanced Directives: No; Patient does not want information on Advanced Directives; Do not resuscitate: No; Living Will: No; Medical Power of  Attorney: No Physician Affirmation I have reviewed and agree with the above information. TEIGE, ROUNTREE ([id number]) Electronic Signature(s) Signed: 07/03/2017 1:29:11 PM By: Lenda Kelp PA-C Signed: 07/03/2017 5:01:42 PM By: Curtis Sites Entered By: Lenda Kelp on 07/03/2017 13:16:12 Maxwell, Hunter ([id number]) -------------------------------------------------------------------------------- SuperBill Details Patient Name: [name] Date of Service: 07/03/2017 Medical Record Number: [id number] Patient Account Number: [id number] Date of Birth/Sex: 10-Feb-1971 (47 y.o. Male) Treating RN: Curtis Sites Primary Care Provider: PATIENT, NO Other Clinician: Referring Provider: Janalyn Harder Treating Provider/Extender: Linwood Dibbles, Jahmia Berrett Weeks in Treatment: 4 Diagnosis Coding ICD-10 Codes Code Description S61.303A Unspecified open wound of left middle finger with damage to nail, initial encounter S62.663B Nondisplaced fracture of distal phalanx of left middle finger, initial encounter for open fracture F17.218 Nicotine dependence, cigarettes, with other nicotine-induced disorders Facility Procedures CPT4 Code: [id number] Description: [phone] - WOUND CARE VISIT-LEV 2 EST PT Modifier: Quantity: 1 Physician Procedures CPT4: Description Modifier Quantity Code [id number] - WC PHYS LEVEL 3 - EST PT 1 ICD-10 Diagnosis Description S61.303A Unspecified open wound of left middle finger with damage to nail, initial encounter S62.663B Nondisplaced fracture of distal phalanx  of left middle finger, initial encounter for open fracture F17.218 Nicotine dependence, cigarettes, with other nicotine-induced disorders Electronic Signature(s) Signed: 07/03/2017 3:45:11 PM By: Curtis Sites Previous Signature: 07/03/2017 1:29:11 PM Version By: Lenda Kelp PA-C Entered By: Curtis Sites on 07/03/2017 15:45:11

## 2017-07-10 ENCOUNTER — Encounter: Payer: Worker's Compensation | Attending: Physician Assistant | Admitting: Physician Assistant

## 2017-07-10 DIAGNOSIS — W3189XA Contact with other specified machinery, initial encounter: Secondary | ICD-10-CM | POA: Diagnosis not present

## 2017-07-10 DIAGNOSIS — S61303A Unspecified open wound of left middle finger with damage to nail, initial encounter: Secondary | ICD-10-CM | POA: Diagnosis present

## 2017-07-10 DIAGNOSIS — Y939 Activity, unspecified: Secondary | ICD-10-CM | POA: Diagnosis not present

## 2017-07-10 DIAGNOSIS — Y990 Civilian activity done for income or pay: Secondary | ICD-10-CM | POA: Insufficient documentation

## 2017-07-10 DIAGNOSIS — F17218 Nicotine dependence, cigarettes, with other nicotine-induced disorders: Secondary | ICD-10-CM | POA: Diagnosis not present

## 2017-07-11 NOTE — Progress Notes (Signed)
ATWOOD, ADCOCK ([id number]) Visit Report for 07/10/2017 Arrival Information Details Patient Name: [name], [name]. Date of Service: 07/10/2017 12:30 PM Medical Record Number: [id number] Patient Account Number: [id number] Date of Birth/Sex: 05-17-1971 (47 y.o. Male) Treating RN: Curtis Sites Primary Care Kassadie Pancake: PATIENT, NO Other Clinician: Referring Annarae Macnair: Janalyn Harder Treating Jordis Repetto/Extender: Linwood Dibbles, HOYT Weeks in Treatment: 5 Visit Information History Since Last Visit Added or deleted any medications: No Patient Arrived: Ambulatory Any new allergies or adverse reactions: No Arrival Time: 12:36 Had a fall or experienced change in No Accompanied By: self activities of daily living that may affect Transfer Assistance: None risk of falls: Patient Identification Verified: Yes Signs or symptoms of abuse/neglect since last visito No Secondary Verification Process Completed: Yes Hospitalized since last visit: No Has Dressing in Place as Prescribed: Yes Pain Present Now: No Electronic Signature(s) Signed: 07/10/2017 4:22:41 PM By: Curtis Sites Entered By: Curtis Sites on 07/10/2017 12:37:40 [name] ([id number]) -------------------------------------------------------------------------------- Clinic Level of Care Assessment Details Patient Name: [name] Date of Service: 07/10/2017 12:30 PM Medical Record Number: [id number] Patient Account Number: [id number] Date of Birth/Sex: Jan 07, 1971 (47 y.o. Male) Treating RN: Curtis Sites Primary Care Cortlin Marano: PATIENT, NO Other Clinician: Referring Kaleisha Bhargava: Janalyn Harder Treating Romulus Hanrahan/Extender: Linwood Dibbles, HOYT Weeks in Treatment: 5 Clinic Level of Care Assessment Items TOOL 4 Quantity Score []  - Use when only an EandM is performed on FOLLOW-UP visit 0 ASSESSMENTS - Nursing Assessment / Reassessment X - Reassessment of Co-morbidities (includes updates in patient status) 1 10 X- 1 5 Reassessment of  Adherence to Treatment Plan ASSESSMENTS - Wound and Skin Assessment / Reassessment X - Simple Wound Assessment / Reassessment - one wound 1 5 []  - 0 Complex Wound Assessment / Reassessment - multiple wounds []  - 0 Dermatologic / Skin Assessment (not related to wound area) ASSESSMENTS - Focused Assessment []  - Circumferential Edema Measurements - multi extremities 0 []  - 0 Nutritional Assessment / Counseling / Intervention []  - 0 Lower Extremity Assessment (monofilament, tuning fork, pulses) []  - 0 Peripheral Arterial Disease Assessment (using hand held doppler) ASSESSMENTS - Ostomy and/or Continence Assessment and Care []  - Incontinence Assessment and Management 0 []  - 0 Ostomy Care Assessment and Management (repouching, etc.) PROCESS - Coordination of Care X - Simple Patient / Family Education for ongoing care 1 15 []  - 0 Complex (extensive) Patient / Family Education for ongoing care []  - 0 Staff obtains Chiropractor, Records, Test Results / Process Orders []  - 0 Staff telephones HHA, Nursing Homes / Clarify orders / etc []  - 0 Routine Transfer to another Facility (non-emergent condition) []  - 0 Routine Hospital Admission (non-emergent condition) []  - 0 New Admissions / Manufacturing engineer / Ordering NPWT, Apligraf, etc. []  - 0 Emergency Hospital Admission (emergent condition) X- 1 10 Simple Discharge Coordination ZAVIER, CANELA ([id number]) []  - 0 Complex (extensive) Discharge Coordination PROCESS - Special Needs []  - Pediatric / Minor Patient Management 0 []  - 0 Isolation Patient Management []  - 0 Hearing / Language / Visual special needs []  - 0 Assessment of Community assistance (transportation, D/C planning, etc.) []  - 0 Additional assistance / Altered mentation []  - 0 Support Surface(s) Assessment (bed, cushion, seat, etc.) INTERVENTIONS - Wound Cleansing / Measurement X - Simple Wound Cleansing - one wound 1 5 []  - 0 Complex Wound Cleansing - multiple  wounds X- 1 5 Wound Imaging (photographs - any number of wounds) []  - 0 Wound Tracing (instead of photographs) X- 1 5 Simple Wound  Measurement - one wound []  - 0 Complex Wound Measurement - multiple wounds INTERVENTIONS - Wound Dressings X - Small Wound Dressing one or multiple wounds 1 10 []  - 0 Medium Wound Dressing one or multiple wounds []  - 0 Large Wound Dressing one or multiple wounds []  - 0 Application of Medications - topical []  - 0 Application of Medications - injection INTERVENTIONS - Miscellaneous []  - External ear exam 0 []  - 0 Specimen Collection (cultures, biopsies, blood, body fluids, etc.) []  - 0 Specimen(s) / Culture(s) sent or taken to Lab for analysis []  - 0 Patient Transfer (multiple staff / Nurse, adult / Similar devices) []  - 0 Simple Staple / Suture removal (25 or less) []  - 0 Complex Staple / Suture removal (26 or more) []  - 0 Hypo / Hyperglycemic Management (close monitor of Blood Glucose) []  - 0 Ankle / Brachial Index (ABI) - do not check if billed separately X- 1 5 Vital Signs Breunig, Younis K. ([id number]) Has the patient been seen at the hospital within the last three years: Yes Total Score: 75 Level Of Care: New/Established - Level 2 Electronic Signature(s) Signed: 07/10/2017 4:22:41 PM By: Curtis Sites Entered By: Curtis Sites on 07/10/2017 13:51:42 [name] ([id number]) -------------------------------------------------------------------------------- Encounter Discharge Information Details Patient Name: [name]. Date of Service: 07/10/2017 12:30 PM Medical Record Number: [id number] Patient Account Number: [id number] Date of Birth/Sex: 1971/01/19 (47 y.o. Male) Treating RN: Curtis Sites Primary Care Diahann Guajardo: PATIENT, NO Other Clinician: Referring Nailah Luepke: Janalyn Harder Treating Rashaan Wyles/Extender: Linwood Dibbles, HOYT Weeks in Treatment: 5 Encounter Discharge Information Items Discharge Pain Level: 0 Discharge  Condition: Stable Ambulatory Status: Ambulatory Discharge Destination: Home Transportation: Private Auto Accompanied By: self Schedule Follow-up Appointment: Yes Medication Reconciliation completed and No provided to Patient/Care Myleigh Amara: Provided on Clinical Summary of Care: 07/10/2017 Form Type Recipient Paper Patient TS Electronic Signature(s) Signed: 07/10/2017 1:52:42 PM By: Curtis Sites Entered By: Curtis Sites on 07/10/2017 13:52:42 [name] ([id number]) -------------------------------------------------------------------------------- Multi Wound Chart Details Patient Name: [name]. Date of Service: 07/10/2017 12:30 PM Medical Record Number: [id number] Patient Account Number: [id number] Date of Birth/Sex: 23-May-1971 (47 y.o. Male) Treating RN: Curtis Sites Primary Care Vieva Brummitt: PATIENT, NO Other Clinician: Referring Khady Vandenberg: Janalyn Harder Treating Axel Meas/Extender: STONE III, HOYT Weeks in Treatment: 5 Vital Signs Height(in): 76 Pulse(bpm): 72 Weight(lbs): 235 Blood Pressure(mmHg): 168/82 Body Mass Index(BMI): 29 Temperature(F): 98.0 Respiratory Rate 18 (breaths/min): Photos: [1:No Photos] [N/A:N/A] Wound Location: [1:Left Hand - 3rd Digit] [N/A:N/A] Wounding Event: [1:Trauma] [N/A:N/A] Primary Etiology: [1:Trauma, Other] [N/A:N/A] Date Acquired: [1:05/23/2017] [N/A:N/A] Weeks of Treatment: [1:5] [N/A:N/A] Wound Status: [1:Open] [N/A:N/A] Pending Amputation on [1:Yes] [N/A:N/A] Presentation: Measurements L x W x D [1:0.8x0.2x0.1] [N/A:N/A] (cm) Area (cm) : [1:0.126] [N/A:N/A] Volume (cm) : [1:0.013] [N/A:N/A] % Reduction in Area: [1:96.40%] [N/A:N/A] % Reduction in Volume: [1:96.30%] [N/A:N/A] Classification: [1:Full Thickness With Exposed Support Structures] [N/A:N/A] Exudate Amount: [1:Small] [N/A:N/A] Exudate Type: [1:Serous] [N/A:N/A] Exudate Color: [1:amber] [N/A:N/A] Wound Margin: [1:Flat and Intact]  [N/A:N/A] Granulation Amount: [1:Large (67-100%)] [N/A:N/A] Granulation Quality: [1:Red] [N/A:N/A] Necrotic Amount: [1:Small (1-33%)] [N/A:N/A] Exposed Structures: [1:Fat Layer (Subcutaneous Tissue) Exposed: Yes Fascia: No Tendon: No Muscle: No Joint: No Bone: No] [N/A:N/A] Epithelialization: [1:Medium (34-66%)] [N/A:N/A] Periwound Skin Texture: [1:Excoriation: Yes Induration: No Callus: No Crepitus: No] [N/A:N/A] Rash: No Scarring: No Periwound Skin Moisture: Maceration: No N/A N/A Dry/Scaly: No Periwound Skin Color: Atrophie Blanche: No N/A N/A Cyanosis: No Ecchymosis: No Erythema: No Hemosiderin Staining: No Mottled: No Pallor: No Rubor: No Temperature: No Abnormality N/A  N/A Tenderness on Palpation: Yes N/A N/A Wound Preparation: Ulcer Cleansing: N/A N/A Rinsed/Irrigated with Saline Topical Anesthetic Applied: Other: lidociane 4% Treatment Notes Electronic Signature(s) Signed: 07/10/2017 4:22:41 PM By: Curtis Sitesorthy, Joanna Entered By: Curtis Sitesorthy, Joanna on 07/10/2017 12:43:10 Hunter ClarksSTACEY, Mose K. ([id number]) -------------------------------------------------------------------------------- Multi-Disciplinary Care Plan Details Patient Name: Hunter ClarksSTACEY, Blas K. Date of Service: 07/10/2017 12:30 PM Medical Record Number: [id number] Patient Account Number: [id number] Date of Birth/Sex: 12/20/1970 1(46 y.o. Male) Treating RN: Curtis Sitesorthy, Joanna Primary Care Aeriana Speece: PATIENT, NO Other Clinician: Referring Amarachi Kotz: Janalyn HarderSINGER, SAMANTHA Treating Yalissa Fink/Extender: Linwood DibblesSTONE III, HOYT Weeks in Treatment: 5 Active Inactive ` Orientation to the Wound Care Program Nursing Diagnoses: Knowledge deficit related to the wound healing center program Goals: Patient/caregiver will verbalize understanding of the Wound Healing Center Program Date Initiated: 06/02/2017 Target Resolution Date: 08/12/2017 Goal Status: Active Interventions: Provide education on orientation to the wound center Notes: ` Wound/Skin  Impairment Nursing Diagnoses: Impaired tissue integrity Goals: Ulcer/skin breakdown will heal within 14 weeks Date Initiated: 06/02/2017 Target Resolution Date: 08/12/2017 Goal Status: Active Interventions: Assess patient/caregiver ability to obtain necessary supplies Assess patient/caregiver ability to perform ulcer/skin care regimen upon admission and as needed Assess ulceration(s) every visit Notes: Electronic Signature(s) Signed: 07/10/2017 4:22:41 PM By: Curtis Sitesorthy, Joanna Entered By: Curtis Sitesorthy, Joanna on 07/10/2017 12:43:03 Hunter ClarksSTACEY, Oskar K. ([id number]) -------------------------------------------------------------------------------- Pain Assessment Details Patient Name: Hunter ClarksSTACEY, Aleksey K. Date of Service: 07/10/2017 12:30 PM Medical Record Number: [id number] Patient Account Number: [id number] Date of Birth/Sex: 05/29/1971 [age] y.o. Male) Treating RN: Curtis Sitesorthy, Joanna Primary Care Laren Orama: PATIENT, NO Other Clinician: Referring Andrzej Scully: Janalyn HarderSINGER, SAMANTHA Treating Darina Hartwell/Extender: Linwood DibblesSTONE III, HOYT Weeks in Treatment: 5 Active Problems Location of Pain Severity and Description of Pain Patient Has Paino No Site Locations Pain Management and Medication Current Pain Management: Notes Topical or injectable lidocaine is offered to patient for acute pain when surgical debridement is performed. If needed, Patient is instructed to use over the counter pain medication for the following 24-48 hours after debridement. Wound care MDs do not prescribed pain medications. Patient has chronic pain or uncontrolled pain. Patient has been instructed to make an appointment with their Primary Care Physician for pain management. Electronic Signature(s) Signed: 07/10/2017 4:22:41 PM By: Curtis Sitesorthy, Joanna Entered By: Curtis Sitesorthy, Joanna on 07/10/2017 12:38:14 Hunter ClarksSTACEY, Varnell K. ([id number]) -------------------------------------------------------------------------------- Patient/Caregiver Education Details Patient Name:  Hunter ClarksSTACEY, Demarie K. Date of Service: 07/10/2017 12:30 PM Medical Record Number: [id number] Patient Account Number: [id number] Date of Birth/Gender: 09/17/1970 [age] y.o. Male) Treating RN: Curtis Sitesorthy, Joanna Primary Care Physician: PATIENT, NO Other Clinician: Referring Physician: Janalyn HarderSINGER, SAMANTHA Treating Physician/Extender: Skeet SimmerSTONE III, HOYT Weeks in Treatment: 5 Education Assessment Education Provided To: Patient Education Topics Provided Wound/Skin Impairment: Handouts: Other: wound care to continue as ordered Methods: Demonstration, Explain/Verbal Responses: State content correctly Electronic Signature(s) Signed: 07/10/2017 4:22:41 PM By: Curtis Sitesorthy, Joanna Entered By: Curtis Sitesorthy, Joanna on 07/10/2017 13:52:59 Hunter ClarksSTACEY, Denarius K. ([id number]) -------------------------------------------------------------------------------- Wound Assessment Details Patient Name: Hunter ClarksSTACEY, Haik K. Date of Service: 07/10/2017 12:30 PM Medical Record Number: [id number] Patient Account Number: [id number] Date of Birth/Sex: 04/06/1971 [age] y.o. Male) Treating RN: Curtis Sitesorthy, Joanna Primary Care Eulon Allnutt: PATIENT, NO Other Clinician: Referring Ailea Rhatigan: Janalyn HarderSINGER, SAMANTHA Treating Dael Howland/Extender: STONE III, HOYT Weeks in Treatment: 5 Wound Status Wound Number: 1 Primary Etiology: Trauma, Other Wound Location: Left Hand - 3rd Digit Wound Status: Open Wounding Event: Trauma Date Acquired: 05/23/2017 Weeks Of Treatment: 5 Clustered Wound: No Pending Amputation On Presentation Photos Photo Uploaded By: Curtis Sitesorthy, Joanna on 07/10/2017 13:48:37 Wound Measurements Length: (cm) 0.8 Width: (cm) 0.2 Depth: (cm) 0.1 Area: (cm)  0.126 Volume: (cm) 0.013 % Reduction in Area: 96.4% % Reduction in Volume: 96.3% Epithelialization: Medium (34-66%) Tunneling: No Undermining: No Wound Description Full Thickness With Exposed Support Classification: Structures Wound Margin: Flat and Intact Exudate Small Amount: Exudate Type:  Serous Exudate Color: amber Foul Odor After Cleansing: No Slough/Fibrino Yes Wound Bed Granulation Amount: Large (67-100%) Exposed Structure Granulation Quality: Red Fascia Exposed: No Necrotic Amount: Small (1-33%) Fat Layer (Subcutaneous Tissue) Exposed: Yes Necrotic Quality: Adherent Slough Tendon Exposed: No Muscle Exposed: No Joint Exposed: No NAITIK, HERMANN. ([id number]) Bone Exposed: No Periwound Skin Texture Texture Color No Abnormalities Noted: No No Abnormalities Noted: No Callus: No Atrophie Blanche: No Crepitus: No Cyanosis: No Excoriation: Yes Ecchymosis: No Induration: No Erythema: No Rash: No Hemosiderin Staining: No Scarring: No Mottled: No Pallor: No Moisture Rubor: No No Abnormalities Noted: No Dry / Scaly: No Temperature / Pain Maceration: No Temperature: No Abnormality Tenderness on Palpation: Yes Wound Preparation Ulcer Cleansing: Rinsed/Irrigated with Saline Topical Anesthetic Applied: Other: lidociane 4%, Treatment Notes Wound #1 (Left Hand - 3rd Digit) 1. Cleansed with: Clean wound with Normal Saline 4. Dressing Applied: Prisma Ag 5. Secondary Dressing Applied Dry Gauze Kerlix/Conform 7. Secured with Tape Notes conform and netting Electronic Signature(s) Signed: 07/10/2017 4:22:41 PM By: Curtis Sites Entered By: Curtis Sites on 07/10/2017 12:42:57 [name] ([id number]) -------------------------------------------------------------------------------- Vitals Details Patient Name: [name] Date of Service: 07/10/2017 12:30 PM Medical Record Number: [id number] Patient Account Number: [id number] Date of Birth/Sex: 1970/06/10 (47 y.o. Male) Treating RN: Curtis Sites Primary Care Dairon Procter: PATIENT, NO Other Clinician: Referring Huntley Demedeiros: Janalyn Harder Treating Manasvi Dickard/Extender: STONE III, HOYT Weeks in Treatment: 5 Vital Signs Time Taken: 12:38 Temperature (F): 98.0 Height (in): 76 Pulse (bpm):  72 Weight (lbs): 235 Respiratory Rate (breaths/min): 18 Body Mass Index (BMI): 28.6 Blood Pressure (mmHg): 168/82 Reference Range: 80 - 120 mg / dl Electronic Signature(s) Signed: 07/10/2017 4:22:41 PM By: Curtis Sites Entered By: Curtis Sites on 07/10/2017 12:39:11

## 2017-07-11 NOTE — Progress Notes (Signed)
[name], Hunter K. ([id number]) Visit Report for 07/10/2017 Chief Complaint Document Details Patient Name: [name], Hunter K. Date of Service: 07/10/2017 12:30 PM Medical Record Number: [id number] Patient Account Number: [id number] Date of Birth/Sex: 01/06/1971 [age] y.o. Male) Treating RN: Curtis Sitesorthy, Joanna Primary Care Provider: PATIENT, NO Other Clinician: Referring Provider: Janalyn HarderSINGER, SAMANTHA Treating Provider/Extender: Linwood DibblesSTONE Maxwell, [name] in Treatment: 5 Information Obtained from: Patient Chief Complaint Patient seen for complaints of Non-Healing Wound to the distal phalanx of the left middle finger which he has had since 05/23/2017 Electronic Signature(s) Signed: 07/10/2017 4:49:26 PM By: Lenda KelpStone Maxwell, Jolleen Seman PA-C Entered By: Lenda KelpStone Maxwell, Britanie Harshman on 07/10/2017 12:54:15 [name], Hunter K. ([id number]) -------------------------------------------------------------------------------- HPI Details Patient Name: [name], Hunter K. Date of Service: 07/10/2017 12:30 PM Medical Record Number: [id number] Patient Account Number: [id number] Date of Birth/Sex: 09/27/1970 [age] y.o. Male) Treating RN: Curtis Sitesorthy, Joanna Primary Care Provider: PATIENT, NO Other Clinician: Referring Provider: Janalyn HarderSINGER, SAMANTHA Treating Provider/Extender: Linwood DibblesSTONE Maxwell, Heinrich Fertig Maxwell in Treatment: 5 History of Present Illness HPI Description: this 47 year old young man was referred by the [employer] team at [facility] and has a injury to the left middle finger distal phalanx on 05/23/2017. He sustained a crush injury in a grinder and the distal tip of the left middle finger was ripped and torn off including the fingernail. He had an x-ray of the left hand IMPRESSION:1. Middle finger distal soft tissue laceration with loss of soft tissue to the finger tip. Subtle evidence of minimal fracturing of the distal margin of the distal tuft. No other fracture. No dislocation. No radiopaque foreign  body. the patient does not have any significant past medical history but is a smoker and smokes about a pack of cigarettes a day. The patient was prescribed Cefadroxil 500 mg twice a day for 7 days and given some dressing changes and asked to see the wound care. the patient was not referred to hand a Engineer, petroleumplastic surgeon. 06/12/17 on evaluation today patient appears to be doing decently well in regard to the injury only distal portion of his left middle finger. He is having some discomfort unfortunately. Nonetheless he has not seen the orthopedic specialist as of yet that we referred him to. He states he really wasn't sure if that was something that was necessary or not and subsequently he also states that he has had a difficult time getting in touch with the case manager and adjuster on his case over the holiday. Nonetheless he has continued to perform the dressing changes appropriately and the wound does seem to be showing signs of improvement which is excellent news. Upon review of the x-ray it does appear that the fracture of the tuft was minimal nonetheless I do think she needs orthopedic specialist to monitor this as far as ensuring complete healing and obviously rate and releasing the patient once everything is said and done. 06/19/17 on evaluation today patient's wound on the tip of his left middle finger appears to be doing much better. The Melburn PopperSantyl has been doing its job and a lot of the slough was very loose. At the very tip he did have a little bit of slough that was more adherent. There does not appear to be any infection and he actually has [facility] in regard to the tuft fracture. He seems to still be having some pain from likely this fracture. 06/26/17 on evaluation today patient appears to be doing excellent in regard to  his left middle finger ulcer. He has been tolerating the dressing changes without complication. [facility]. With that being said they did not see any evidence of fracture at this point according to what the patient is telling me although I do not have that note for review personally as of yet. Nonetheless it sounds like things are doing very well and the fact that there is no fracture is even better. Again this had been noted on the initial report. He really does not have any significant slough today on evaluation. 07/03/17 on evaluation today patient appears to be doing excellent in regard to his finger ulcer. He has been tolerating the dressing changes without complication and continues to have greater and greater epithelialization week by week. Overall I'm very pleased with the progress that he has made. Unfortunately we still have not gotten the report from the orthopedic specialist that he saw. We did request this last week. 07/10/17 on evaluation today patient appears to be doing excellent in regard to his left middle finger ulcer. He has been tolerating the dressing changes without complication and overall this is showing signs of excellent epithelialization much better than even last week. Overall I'm extremely pleased with the progress he has made. Patient is likewise happy and continues to work full duty in fact he tells me that he is been able to sense the second day following the entry. He is having no pain. Electronic Signature(s) Signed: 07/10/2017 4:49:26 PM By: Lenda Kelp PA-C Entered By: Lenda Kelp on 07/10/2017 12:56:57 [name], [name] ([id number]) [name] ([id number]) -------------------------------------------------------------------------------- Physical Exam Details Patient Name: [name], LUNDBERG. Date of Service: 07/10/2017 12:30 PM Medical Record Number: [id number] Patient Account Number: [id number] Date of Birth/Sex: [birth year] (47 y.o. Male) Treating RN: Curtis Sites Primary Care Provider: PATIENT, NO Other  Clinician: Referring Provider: Janalyn Harder Treating Provider/Extender: [name], Lenia Housley Maxwell in Treatment: 5 Constitutional Well-nourished and well-hydrated in no acute distress. Respiratory normal breathing without difficulty. clear to auscultation bilaterally. Cardiovascular regular rate and rhythm with normal S1, S2. Psychiatric this patient is able to make decisions and demonstrates good insight into disease process. Alert and Oriented x 3. pleasant and cooperative. Notes Patient's wound bed shows excellent granulation tissue there is no significant hyper granulation and he does have significant epithelialization. I'm very pleased with the progress that he has made. Electronic Signature(s) Signed: 07/10/2017 4:49:26 PM By: Lenda Kelp PA-C Entered By: Lenda Kelp on 07/10/2017 12:57:49 [name] ([id number]) -------------------------------------------------------------------------------- Physician Orders Details Patient Name: [name] Date of Service: 07/10/2017 12:30 PM Medical Record Number: [id number] Patient Account Number: [id number] Date of Birth/Sex: 05-22-1971 (47 y.o. Male) Treating RN: Curtis Sites Primary Care Provider: PATIENT, NO Other Clinician: Referring Provider: Janalyn Harder Treating Provider/Extender: [name], Ell Tiso Maxwell in Treatment: 5 Verbal / Phone Orders: No Diagnosis Coding Wound Cleansing Wound #1 Left Hand - 3rd Digit o Clean wound with Normal Saline. o Cleanse wound with mild soap and water o May Shower, gently pat wound dry prior to applying new dressing. Anesthetic (add to Medication List) Wound #1 Left Hand - 3rd Digit o Topical Lidocaine 4% cream applied to wound bed prior to debridement (In Clinic Only). Primary Wound Dressing Wound #1 Left Hand - 3rd Digit o Prisma Ag - moisten with saline Secondary Dressing Wound #1 Left Hand - 3rd Digit o Conform/Kerlix - non-stick pad o Other - netting  #2 Dressing  Change Frequency Wound #1 Left Hand - 3rd Digit o Change dressing every day. Follow-up Appointments Wound #1 Left Hand - 3rd Digit o Return Appointment in 1 week. Additional Orders / Instructions Wound #1 Left Hand - 3rd Digit o Stop Smoking o Vitamin A; Vitamin C, Zinc - over the counter supplements o Increase protein intake. Electronic Signature(s) Signed: 07/10/2017 4:22:41 PM By: Curtis Sites Signed: 07/10/2017 4:49:26 PM By: Lenda Kelp PA-C Entered By: Curtis Sites on 07/10/2017 12:55:01 [name] ([id number]) -------------------------------------------------------------------------------- Problem List Details Patient Name: CLETE, KUCH. Date of Service: 07/10/2017 12:30 PM Medical Record Number: [id number] Patient Account Number: [id number] Date of Birth/Sex: May 18, 1971 (47 y.o. Male) Treating RN: Curtis Sites Primary Care Provider: PATIENT, NO Other Clinician: Referring Provider: Janalyn Harder Treating Provider/Extender: Linwood Dibbles, Alayza Pieper Maxwell in Treatment: 5 Active Problems ICD-10 Encounter Code Description Active Date Diagnosis S61.303A Unspecified open wound of left middle finger with damage to nail, 06/02/2017 Yes initial encounter S62.663B Nondisplaced fracture of distal phalanx of left middle finger, initial 06/02/2017 Yes encounter for open fracture F17.218 Nicotine dependence, cigarettes, with other nicotine-induced 06/02/2017 Yes disorders Inactive Problems Resolved Problems Electronic Signature(s) Signed: 07/10/2017 4:49:26 PM By: Lenda Kelp PA-C Entered By: Lenda Kelp on 07/10/2017 12:54:05 [name] ([id number]) -------------------------------------------------------------------------------- Progress Note Details Patient Name: [name] Date of Service: 07/10/2017 12:30 PM Medical Record Number: [id number] Patient Account Number: [id number] Date of Birth/Sex: [birth year] (47 y.o. Male) Treating  RN: Curtis Sites Primary Care Provider: PATIENT, NO Other Clinician: Referring Provider: Janalyn Harder Treating Provider/Extender: Linwood Dibbles, Bryli Mantey Maxwell in Treatment: 5 Subjective Chief Complaint Information obtained from Patient Patient seen for complaints of Non-Healing Wound to the distal phalanx of the left middle finger which he has had since 05/23/2017 History of Present Illness (HPI) this 47 year old young man was referred by the [employer] team at [facility] employee health and wellness at [facility] and has a injury to the left middle finger distal phalanx on 05/23/2017. He sustained a crush injury in a grinder and the distal tip of the left middle finger was ripped and torn off including the fingernail. He had an x-ray of the left hand IMPRESSION:1. Middle finger distal soft tissue laceration with loss of soft tissue to the finger tip. Subtle evidence of minimal fracturing of the distal margin of the distal tuft. No other fracture. No dislocation. No radiopaque foreign body. the patient does not have any significant past medical history but is a smoker and smokes about a pack of cigarettes a day. The patient was prescribed Cefadroxil 500 mg twice a day for 7 days and given some dressing changes and asked to see the wound care. the patient was not referred to hand a Engineer, petroleum. 06/12/17 on evaluation today patient appears to be doing decently well in regard to the injury only distal portion of his left middle finger. He is having some discomfort unfortunately. Nonetheless he has not seen the orthopedic specialist as of yet that we referred him to. He states he really wasn't sure if that was something that was necessary or not and subsequently he also states that he has had a difficult time getting in touch with the case manager and adjuster on his case over the holiday. Nonetheless he has continued to perform the dressing changes appropriately and the  wound does seem to be showing signs of improvement which is excellent news. Upon review of the x-ray it does appear that the fracture of the  tuft was minimal nonetheless I do think she needs orthopedic specialist to monitor this as far as ensuring complete healing and obviously rate and releasing the patient once everything is said and done. 06/19/17 on evaluation today patient's wound on the tip of his left middle finger appears to be doing much better. The Melburn Popper has been doing its job and a lot of the slough was very loose. At the very tip he did have a little bit of slough that was more adherent. There does not appear to be any infection and he actually has an appointment with orthopedics tomorrow at [facility] for evaluation in regard to the tuft fracture. He seems to still be having some pain from likely this fracture. 06/26/17 on evaluation today patient appears to be doing excellent in regard to his left middle finger ulcer. He has been tolerating the dressing changes without complication. [facility]. With that being said they did not see any evidence of fracture at this point according to what the patient is telling me although I do not have that note for review personally as of yet. Nonetheless it sounds like things are doing very well and the fact that there is no fracture is even better. Again this had been noted on the initial report. He really does not have any significant slough today on evaluation. 07/03/17 on evaluation today patient appears to be doing excellent in regard to his finger ulcer. He has been tolerating the dressing changes without complication and continues to have greater and greater epithelialization week by week. Overall I'm very pleased with the progress that he has made. Unfortunately we still have not gotten the report from the orthopedic specialist that he saw. We did request this last week. 07/10/17 on evaluation today  patient appears to be doing excellent in regard to his left middle finger ulcer. He has been tolerating the dressing changes without complication and overall this is showing signs of excellent epithelialization much better than even last week. Overall I'm extremely pleased with the progress he has made. Patient is likewise happy and continues to work full duty in fact he tells me that he is been able to sense the second day following the entry. He is having no Stahl, Verlyn K. ([id number]) pain. Patient History Information obtained from Patient. Family History No family history of Cancer, Diabetes, Heart Disease, Hereditary Spherocytosis, Hypertension, Kidney Disease, Lung Disease, Seizures, Stroke, Thyroid Problems, Tuberculosis. Social History Current every day smoker - 1/2 pack to 1 pack, Marital Status - Single, Alcohol Use - Rarely, Drug Use - Current History - marijana occasionally, Caffeine Use - Daily. Review of Systems (ROS) Constitutional Symptoms (General Health) Denies complaints or symptoms of Fever, Chills. Respiratory The patient has no complaints or symptoms. Cardiovascular The patient has no complaints or symptoms. Psychiatric The patient has no complaints or symptoms. Objective Constitutional Well-nourished and well-hydrated in no acute distress. Vitals Time Taken: 12:38 PM, Height: 76 in, Weight: 235 lbs, BMI: 28.6, Temperature: 98.0 F, Pulse: 72 bpm, Respiratory Rate: 18 breaths/min, Blood Pressure: 168/82 mmHg. Respiratory normal breathing without difficulty. clear to auscultation bilaterally. Cardiovascular regular rate and rhythm with normal S1, S2. Psychiatric this patient is able to make decisions and demonstrates good insight into disease process. Alert and Oriented x 3. pleasant and cooperative. General Notes: Patient's wound bed shows excellent granulation tissue there is no significant hyper granulation and he does have significant epithelialization.  I'm very pleased with the progress  that he has made. Integumentary (Hair, Skin) Wound #1 status is Open. Original cause of wound was Trauma. The wound is located on the Left Hand - 3rd Digit. The wound measures 0.8cm length x 0.2cm width x 0.1cm depth; 0.126cm^2 area and 0.013cm^3 volume. There is Fat Layer (Subcutaneous Tissue) Exposed exposed. There is no tunneling or undermining noted. There is a small amount of serous [name], Thorne K. ([id number]) drainage noted. The wound margin is flat and intact. There is large (67-100%) red granulation within the wound bed. There is a small (1-33%) amount of necrotic tissue within the wound bed including Adherent Slough. The periwound skin appearance exhibited: Excoriation. The periwound skin appearance did not exhibit: Callus, Crepitus, Induration, Rash, Scarring, Dry/Scaly, Maceration, Atrophie Blanche, Cyanosis, Ecchymosis, Hemosiderin Staining, Mottled, Pallor, Rubor, Erythema. Periwound temperature was noted as No Abnormality. The periwound has tenderness on palpation. Assessment Active Problems ICD-10 S61.303A - Unspecified open wound of left middle finger with damage to nail, initial encounter S62.663B - Nondisplaced fracture of distal phalanx of left middle finger, initial encounter for open fracture F17.218 - Nicotine dependence, cigarettes, with other nicotine-induced disorders Plan Wound Cleansing: Wound #1 Left Hand - 3rd Digit: Clean wound with Normal Saline. Cleanse wound with mild soap and water May Shower, gently pat wound dry prior to applying new dressing. Anesthetic (add to Medication List): Wound #1 Left Hand - 3rd Digit: Topical Lidocaine 4% cream applied to wound bed prior to debridement (In Clinic Only). Primary Wound Dressing: Wound #1 Left Hand - 3rd Digit: Prisma Ag - moisten with saline Secondary Dressing: Wound #1 Left Hand - 3rd Digit: Conform/Kerlix - non-stick pad Other - netting #2 Dressing Change  Frequency: Wound #1 Left Hand - 3rd Digit: Change dressing every day. Follow-up Appointments: Wound #1 Left Hand - 3rd Digit: Return Appointment in 1 week. Additional Orders / Instructions: Wound #1 Left Hand - 3rd Digit: Stop Smoking Vitamin A; Vitamin C, Zinc - over the counter supplements Increase protein intake. DAQUAVION, CATALA ([id number]) I'm going to recommend that we continue with the current wound care orders as he seems to be doing excellent at this point patient is in agreement with the plan. We will see were things stand in one Maxwell time. Please see above for specific wound care orders. We will see patient for re-evaluation in 1 week(s) here in the clinic. If anything worsens or changes patient will contact our office for additional recommendations. Electronic Signature(s) Signed: 07/10/2017 4:49:26 PM By: Lenda Kelp PA-C Entered By: Lenda Kelp on 07/10/2017 12:58:09 [name] ([id number]) -------------------------------------------------------------------------------- ROS/PFSH Details Patient Name: [name] Date of Service: 07/10/2017 12:30 PM Medical Record Number: [id number] Patient Account Number: [id number] Date of Birth/Sex: December 17, 1970 (47 y.o. Male) Treating RN: Curtis Sites Primary Care Provider: PATIENT, NO Other Clinician: Referring Provider: Janalyn Harder Treating Provider/Extender: Linwood Dibbles, Aleana Fifita Maxwell in Treatment: 5 Information Obtained From Patient Wound History Do you currently have one or more open woundso Yes How many open wounds do you currently haveo 1 Approximately how long have you had your woundso 10 days How have you been treating your wound(s) until nowo dry bandage Has your wound(s) ever healed and then re-openedo No Have you had any lab work done in the past montho No Have you tested positive for an antibiotic resistant organism (MRSA, VRE)o No Have you tested positive for osteomyelitis (bone infection)o  No Have you had any tests for circulation on your legso No Constitutional Symptoms (General Health) Complaints and  Symptoms: Negative for: Fever; Chills Hematologic/Lymphatic Medical History: Negative for: Anemia; Hemophilia; Human Immunodeficiency Virus; Lymphedema; Sickle Cell Disease Respiratory Complaints and Symptoms: No Complaints or Symptoms Medical History: Negative for: Aspiration; Asthma; Chronic Obstructive Pulmonary Disease (COPD); Pneumothorax; Sleep Apnea; Tuberculosis Cardiovascular Complaints and Symptoms: No Complaints or Symptoms Medical History: Negative for: Angina; Arrhythmia; Congestive Heart Failure; Coronary Artery Disease; Deep Vein Thrombosis; Hypertension; Hypotension; Myocardial Infarction; Peripheral Arterial Disease; Peripheral Venous Disease; Phlebitis; Vasculitis Gastrointestinal Medical History: Negative for: Cirrhosis ; Colitis; Crohnos; Hepatitis A; Hepatitis B; Hepatitis C Endocrine RANEN, DOOLIN ([id number]) Medical History: Negative for: Type I Diabetes; Type II Diabetes Genitourinary Medical History: Negative for: End Stage Renal Disease Immunological Medical History: Negative for: Lupus Erythematosus; Raynaudos; Scleroderma Integumentary (Skin) Medical History: Negative for: History of Burn; History of pressure wounds Musculoskeletal Medical History: Negative for: Gout; Rheumatoid Arthritis; Osteoarthritis; Osteomyelitis Neurologic Medical History: Negative for: Dementia; Neuropathy; Quadriplegia; Paraplegia; Seizure Disorder Oncologic Medical History: Negative for: Received Chemotherapy; Received Radiation Psychiatric Complaints and Symptoms: No Complaints or Symptoms Medical History: Negative for: Anorexia/bulimia; Confinement Anxiety Immunizations Pneumococcal Vaccine: Received Pneumococcal Vaccination: Yes Tetanus Vaccine: Last tetanus shot: 05/23/2017 Implantable Devices Family and Social History Cancer: No;  Diabetes: No; Heart Disease: No; Hereditary Spherocytosis: No; Hypertension: No; Kidney Disease: No; Lung Disease: No; Seizures: No; Stroke: No; Thyroid Problems: No; Tuberculosis: No; Current every day smoker - 1/2 pack to 1 pack; Marital Status - Single; Alcohol Use: Rarely; Drug Use: Current History - marijana occasionally; Caffeine Use: Daily; Financial Concerns: No; Food, Clothing or Shelter Needs: No; Support System Lacking: No; Transportation Concerns: No; Advanced Directives: No; Patient does not want information on Advanced Directives; Do not resuscitate: No; Living Will: No; Medical Power of Attorney: No Physician Affirmation I have reviewed and agree with the above information. DERRIUS, FURTICK ([id number]) Electronic Signature(s) Signed: 07/10/2017 4:22:41 PM By: Curtis Sites Signed: 07/10/2017 4:49:26 PM By: Lenda Kelp PA-C Entered By: Lenda Kelp on 07/10/2017 12:57:25 MATTOX, SCHORR ([id number]) -------------------------------------------------------------------------------- SuperBill Details Patient Name: [name] Date of Service: 07/10/2017 Medical Record Number: [id number] Patient Account Number: [id number] Date of Birth/Sex: 26-Mar-1971 (47 y.o. Male) Treating RN: Curtis Sites Primary Care Provider: PATIENT, NO Other Clinician: Referring Provider: Janalyn Harder Treating Provider/Extender: Linwood Dibbles, Daton Szilagyi Maxwell in Treatment: 5 Diagnosis Coding ICD-10 Codes Code Description S61.303A Unspecified open wound of left middle finger with damage to nail, initial encounter S62.663B Nondisplaced fracture of distal phalanx of left middle finger, initial encounter for open fracture F17.218 Nicotine dependence, cigarettes, with other nicotine-induced disorders Facility Procedures CPT4 Code: [id number] Description: [id number] - WOUND CARE VISIT-LEV 2 EST PT Modifier: Quantity: 1 Physician Procedures CPT4: Description Modifier Quantity Code [id number] - WC PHYS  LEVEL 3 - EST PT 1 ICD-10 Diagnosis Description S61.303A Unspecified open wound of left middle finger with damage to nail, initial encounter S62.663B Nondisplaced fracture of distal phalanx  of left middle finger, initial encounter for open fracture F17.218 Nicotine dependence, cigarettes, with other nicotine-induced disorders Electronic Signature(s) Signed: 07/10/2017 1:51:51 PM By: Curtis Sites Signed: 07/10/2017 4:49:26 PM By: Lenda Kelp PA-C Entered By: Curtis Sites on 07/10/2017 13:51:50

## 2017-07-17 ENCOUNTER — Encounter: Payer: Worker's Compensation | Admitting: Physician Assistant

## 2017-07-17 DIAGNOSIS — S61303A Unspecified open wound of left middle finger with damage to nail, initial encounter: Secondary | ICD-10-CM | POA: Diagnosis not present

## 2017-07-18 NOTE — Progress Notes (Signed)
[name], Bradford K. ([id number]) Visit Report for 07/17/2017 Arrival Information Details Patient Name: [name], Hunter K. Date of Service: 07/17/2017 2:30 PM Medical Record Number: [id number] Patient Account Number: [id number] Date of Birth/Sex: 08/30/1970 [age] y.o. Male) Treating RN: Curtis Sitesorthy, Joanna Primary Care Cainan Trull: PATIENT, NO Other Clinician: Referring Fawn Desrocher: Janalyn HarderSINGER, SAMANTHA Treating Gargi Berch/Extender: Linwood DibblesSTONE III, HOYT Weeks in Treatment: 6 Visit Information History Since Last Visit Added or deleted any medications: No Patient Arrived: Ambulatory Any new allergies or adverse reactions: No Arrival Time: 14:34 Had a fall or experienced change in No Accompanied By: self activities of daily living that may affect Transfer Assistance: None risk of falls: Patient Identification Verified: Yes Signs or symptoms of abuse/neglect since last visito No Secondary Verification Process Completed: Yes Hospitalized since last visit: No Has Dressing in Place as Prescribed: Yes Pain Present Now: No Electronic Signature(s) Signed: 07/17/2017 4:44:52 PM By: Curtis Sitesorthy, Joanna Entered By: Curtis Sitesorthy, Joanna on 07/17/2017 14:35:10 [name], Hunter K. ([id number]) -------------------------------------------------------------------------------- Clinic Level of Care Assessment Details Patient Name: [name], Hunter K. Date of Service: 07/17/2017 2:30 PM Medical Record Number: [id number] Patient Account Number: [id number] Date of Birth/Sex: 06/24/1970 [age] y.o. Male) Treating RN: Curtis Sitesorthy, Joanna Primary Care Brevyn Ring: PATIENT, NO Other Clinician: Referring Giovannie Scerbo: Janalyn HarderSINGER, SAMANTHA Treating Vence Lalor/Extender: Linwood DibblesSTONE III, HOYT Weeks in Treatment: 6 Clinic Level of Care Assessment Items TOOL 4 Quantity Score []  - Use when only an EandM is performed on FOLLOW-UP visit 0 ASSESSMENTS - Nursing Assessment / Reassessment X - Reassessment of Co-morbidities (includes updates in patient status) 1 10 X- 1 5 Reassessment of  Adherence to Treatment Plan ASSESSMENTS - Wound and Skin Assessment / Reassessment X - Simple Wound Assessment / Reassessment - one wound 1 5 []  - 0 Complex Wound Assessment / Reassessment - multiple wounds []  - 0 Dermatologic / Skin Assessment (not related to wound area) ASSESSMENTS - Focused Assessment []  - Circumferential Edema Measurements - multi extremities 0 []  - 0 Nutritional Assessment / Counseling / Intervention []  - 0 Lower Extremity Assessment (monofilament, tuning fork, pulses) []  - 0 Peripheral Arterial Disease Assessment (using hand held doppler) ASSESSMENTS - Ostomy and/or Continence Assessment and Care []  - Incontinence Assessment and Management 0 []  - 0 Ostomy Care Assessment and Management (repouching, etc.) PROCESS - Coordination of Care X - Simple Patient / Family Education for ongoing care 1 15 []  - 0 Complex (extensive) Patient / Family Education for ongoing care []  - 0 Staff obtains ChiropractorConsents, Records, Test Results / Process Orders []  - 0 Staff telephones HHA, Nursing Homes / Clarify orders / etc []  - 0 Routine Transfer to another Facility (non-emergent condition) []  - 0 Routine Hospital Admission (non-emergent condition) []  - 0 New Admissions / Manufacturing engineernsurance Authorizations / Ordering NPWT, Apligraf, etc. []  - 0 Emergency Hospital Admission (emergent condition) X- 1 10 Simple Discharge Coordination [name], Hunter K. ([id number]) []  - 0 Complex (extensive) Discharge Coordination PROCESS - Special Needs []  - Pediatric / Minor Patient Management 0 []  - 0 Isolation Patient Management []  - 0 Hearing / Language / Visual special needs []  - 0 Assessment of Community assistance (transportation, D/C planning, etc.) []  - 0 Additional assistance / Altered mentation []  - 0 Support Surface(s) Assessment (bed, cushion, seat, etc.) INTERVENTIONS - Wound Cleansing / Measurement X - Simple Wound Cleansing - one wound 1 5 []  - 0 Complex Wound Cleansing - multiple  wounds X- 1 5 Wound Imaging (photographs - any number of wounds) []  - 0 Wound Tracing (instead of photographs) X- 1 5 Simple Wound  Measurement - one wound []  - 0 Complex Wound Measurement - multiple wounds INTERVENTIONS - Wound Dressings X - Small Wound Dressing one or multiple wounds 1 10 []  - 0 Medium Wound Dressing one or multiple wounds []  - 0 Large Wound Dressing one or multiple wounds []  - 0 Application of Medications - topical []  - 0 Application of Medications - injection INTERVENTIONS - Miscellaneous []  - External ear exam 0 []  - 0 Specimen Collection (cultures, biopsies, blood, body fluids, etc.) []  - 0 Specimen(s) / Culture(s) sent or taken to Lab for analysis []  - 0 Patient Transfer (multiple staff / Nurse, adult / Similar devices) []  - 0 Simple Staple / Suture removal (25 or less) []  - 0 Complex Staple / Suture removal (26 or more) []  - 0 Hypo / Hyperglycemic Management (close monitor of Blood Glucose) []  - 0 Ankle / Brachial Index (ABI) - do not check if billed separately X- 1 5 Vital Signs Hinsch, Satchel K. ([id number]) Has the patient been seen at the hospital within the last three years: Yes Total Score: 75 Level Of Care: New/Established - Level 2 Electronic Signature(s) Signed: 07/17/2017 4:44:52 PM By: Curtis Sites Entered By: Curtis Sites on 07/17/2017 14:49:53 [name] ([id number]) -------------------------------------------------------------------------------- Encounter Discharge Information Details Patient Name: [name]. Date of Service: 07/17/2017 2:30 PM Medical Record Number: [id number] Patient Account Number: [id number] Date of Birth/Sex: 30-Jul-1970 (47 y.o. Male) Treating RN: Curtis Sites Primary Care Scout Gumbs: PATIENT, NO Other Clinician: Referring Keimani Laufer: Janalyn Harder Treating Davin Archuletta/Extender: Linwood Dibbles, HOYT Weeks in Treatment: 6 Encounter Discharge Information Items Discharge Pain Level: 0 Discharge  Condition: Stable Ambulatory Status: Ambulatory Discharge Destination: Home Transportation: Private Auto Accompanied By: self Schedule Follow-up Appointment: Yes Medication Reconciliation completed and No provided to Patient/Care Daisy Lites: Provided on Clinical Summary of Care: 07/17/2017 Form Type Recipient Paper Patient TS Electronic Signature(s) Signed: 07/17/2017 2:59:14 PM By: Francie Massing Entered By: Francie Massing on 07/17/2017 14:59:14 [name] ([id number]) -------------------------------------------------------------------------------- Multi Wound Chart Details Patient Name: [name]. Date of Service: 07/17/2017 2:30 PM Medical Record Number: [id number] Patient Account Number: [id number] Date of Birth/Sex: 02-Jul-1970 (47 y.o. Male) Treating RN: Curtis Sites Primary Care Javonte Elenes: PATIENT, NO Other Clinician: Referring Deboraha Goar: Janalyn Harder Treating Josue Kass/Extender: STONE III, HOYT Weeks in Treatment: 6 Vital Signs Height(in): 76 Pulse(bpm): 75 Weight(lbs): 235 Blood Pressure(mmHg): 167/89 Body Mass Index(BMI): 29 Temperature(F): 98.1 Respiratory Rate 18 (breaths/min): Photos: [1:No Photos] [N/A:N/A] Wound Location: [1:Left Hand - 3rd Digit] [N/A:N/A] Wounding Event: [1:Trauma] [N/A:N/A] Primary Etiology: [1:Trauma, Other] [N/A:N/A] Date Acquired: [1:05/23/2017] [N/A:N/A] Weeks of Treatment: [1:6] [N/A:N/A] Wound Status: [1:Open] [N/A:N/A] Pending Amputation on [1:Yes] [N/A:N/A] Presentation: Measurements L x W x D [1:0.7x0.2x0.1] [N/A:N/A] (cm) Area (cm) : [1:0.11] [N/A:N/A] Volume (cm) : [1:0.011] [N/A:N/A] % Reduction in Area: [1:96.90%] [N/A:N/A] % Reduction in Volume: [1:96.90%] [N/A:N/A] Classification: [1:Full Thickness With Exposed Support Structures] [N/A:N/A] Exudate Amount: [1:Small] [N/A:N/A] Exudate Type: [1:Serous] [N/A:N/A] Exudate Color: [1:amber] [N/A:N/A] Wound Margin: [1:Flat and Intact] [N/A:N/A] Granulation  Amount: [1:Large (67-100%)] [N/A:N/A] Granulation Quality: [1:Red] [N/A:N/A] Necrotic Amount: [1:Small (1-33%)] [N/A:N/A] Exposed Structures: [1:Fat Layer (Subcutaneous Tissue) Exposed: Yes Fascia: No Tendon: No Muscle: No Joint: No Bone: No] [N/A:N/A] Epithelialization: [1:Medium (34-66%)] [N/A:N/A] Periwound Skin Texture: [1:Excoriation: Yes Induration: No Callus: No Crepitus: No] [N/A:N/A] Rash: No Scarring: No Periwound Skin Moisture: Maceration: No N/A N/A Dry/Scaly: No Periwound Skin Color: Atrophie Blanche: No N/A N/A Cyanosis: No Ecchymosis: No Erythema: No Hemosiderin Staining: No Mottled: No Pallor: No Rubor: No Temperature: No Abnormality N/A  N/A Tenderness on Palpation: Yes N/A N/A Wound Preparation: Ulcer Cleansing: N/A N/A Rinsed/Irrigated with Saline Topical Anesthetic Applied: Other: lidociane 4% Treatment Notes Electronic Signature(s) Signed: 07/17/2017 4:44:52 PM By: Curtis Sites Entered By: Curtis Sites on 07/17/2017 14:44:10 [name] ([id number]) -------------------------------------------------------------------------------- Multi-Disciplinary Care Plan Details Patient Name: [name], BRACKNELL. Date of Service: 07/17/2017 2:30 PM Medical Record Number: [id number] Patient Account Number: [id number] Date of Birth/Sex: Apr 15, 1971 (47 y.o. Male) Treating RN: Curtis Sites Primary Care Tzion Wedel: PATIENT, NO Other Clinician: Referring Jamear Carbonneau: Janalyn Harder Treating Alcide Memoli/Extender: Linwood Dibbles, HOYT Weeks in Treatment: 6 Active Inactive ` Orientation to the Wound Care Program Nursing Diagnoses: Knowledge deficit related to the wound healing center program Goals: Patient/caregiver will verbalize understanding of the Wound Healing Center Program Date Initiated: 06/02/2017 Target Resolution Date: 08/12/2017 Goal Status: Active Interventions: Provide education on orientation to the wound center Notes: ` Wound/Skin Impairment Nursing  Diagnoses: Impaired tissue integrity Goals: Ulcer/skin breakdown will heal within 14 weeks Date Initiated: 06/02/2017 Target Resolution Date: 08/12/2017 Goal Status: Active Interventions: Assess patient/caregiver ability to obtain necessary supplies Assess patient/caregiver ability to perform ulcer/skin care regimen upon admission and as needed Assess ulceration(s) every visit Notes: Electronic Signature(s) Signed: 07/17/2017 4:44:52 PM By: Curtis Sites Entered By: Curtis Sites on 07/17/2017 14:44:02 [name] ([id number]) -------------------------------------------------------------------------------- Pain Assessment Details Patient Name: [name]. Date of Service: 07/17/2017 2:30 PM Medical Record Number: [id number] Patient Account Number: [id number] Date of Birth/Sex: 1970/07/28 (46 y.o. Male) Treating RN: Curtis Sites Primary Care Giovana Faciane: PATIENT, NO Other Clinician: Referring Milla Wahlberg: Janalyn Harder Treating Grabiel Schmutz/Extender: Linwood Dibbles, HOYT Weeks in Treatment: 6 Active Problems Location of Pain Severity and Description of Pain Patient Has Paino No Site Locations Pain Management and Medication Current Pain Management: Notes Topical or injectable lidocaine is offered to patient for acute pain when surgical debridement is performed. If needed, Patient is instructed to use over the counter pain medication for the following 24-48 hours after debridement. Wound care MDs do not prescribed pain medications. Patient has chronic pain or uncontrolled pain. Patient has been instructed to make an appointment with their Primary Care Physician for pain management. Electronic Signature(s) Signed: 07/17/2017 4:44:52 PM By: Curtis Sites Entered By: Curtis Sites on 07/17/2017 14:35:17 [name] ([id number]) -------------------------------------------------------------------------------- Patient/Caregiver Education Details Patient Name: [name] Date of Service: 07/17/2017 2:30 PM Medical Record Number: [id number] Patient Account Number: [id number] Date of Birth/Gender: March 11, 1971 (47 y.o. Male) Treating RN: Curtis Sites Primary Care Physician: PATIENT, NO Other Clinician: Referring Physician: Janalyn Harder Treating Physician/Extender: Skeet Simmer in Treatment: 6 Education Assessment Education Provided To: Patient Education Topics Provided Wound/Skin Impairment: Handouts: Other: wound care to continue as ordered Methods: Demonstration, Explain/Verbal Responses: State content correctly Electronic Signature(s) Signed: 07/17/2017 4:44:52 PM By: Curtis Sites Entered By: Curtis Sites on 07/17/2017 14:50:49 [name] ([id number]) -------------------------------------------------------------------------------- Wound Assessment Details Patient Name: [name]. Date of Service: 07/17/2017 2:30 PM Medical Record Number: [id number] Patient Account Number: [id number] Date of Birth/Sex: [birth year] (47 y.o. Male) Treating RN: Curtis Sites Primary Care Destini Cambre: PATIENT, NO Other Clinician: Referring Allard Lightsey: Janalyn Harder Treating Willadene Mounsey/Extender: STONE III, HOYT Weeks in Treatment: 6 Wound Status Wound Number: 1 Primary Etiology: Trauma, Other Wound Location: Left Hand - 3rd Digit Wound Status: Open Wounding Event: Trauma Date Acquired: 05/23/2017 Weeks Of Treatment: 6 Clustered Wound: No Pending Amputation On Presentation Photos Photo Uploaded By: Curtis Sites on 07/17/2017 15:44:03 Wound Measurements Length: (cm) 0.7 Width: (cm) 0.2 Depth: (cm) 0.1 Area: (cm)  0.11 Volume: (cm) 0.011 % Reduction in Area: 96.9% % Reduction in Volume: 96.9% Epithelialization: Medium (34-66%) Tunneling: No Undermining: No Wound Description Full Thickness With Exposed Support Classification: Structures Wound Margin: Flat and Intact Exudate Small Amount: Exudate Type: Serous Exudate  Color: amber Foul Odor After Cleansing: No Slough/Fibrino Yes Wound Bed Granulation Amount: Large (67-100%) Exposed Structure Granulation Quality: Red Fascia Exposed: No Necrotic Amount: Small (1-33%) Fat Layer (Subcutaneous Tissue) Exposed: Yes Necrotic Quality: Adherent Slough Tendon Exposed: No Muscle Exposed: No Joint Exposed: No NTHONY, LEFFERTS. ([id number]) Bone Exposed: No Periwound Skin Texture Texture Color No Abnormalities Noted: No No Abnormalities Noted: No Callus: No Atrophie Blanche: No Crepitus: No Cyanosis: No Excoriation: Yes Ecchymosis: No Induration: No Erythema: No Rash: No Hemosiderin Staining: No Scarring: No Mottled: No Pallor: No Moisture Rubor: No No Abnormalities Noted: No Dry / Scaly: No Temperature / Pain Maceration: No Temperature: No Abnormality Tenderness on Palpation: Yes Wound Preparation Ulcer Cleansing: Rinsed/Irrigated with Saline Topical Anesthetic Applied: Other: lidociane 4%, Treatment Notes Wound #1 (Left Hand - 3rd Digit) 1. Cleansed with: Clean wound with Normal Saline 2. Anesthetic Topical Lidocaine 4% cream to wound bed prior to debridement 4. Dressing Applied: Prisma Ag 5. Secondary Dressing Applied Kerlix/Conform Non-Adherent pad Notes conform and netting Electronic Signature(s) Signed: 07/17/2017 4:44:52 PM By: Curtis Sites Entered By: Curtis Sites on 07/17/2017 14:40:16 [name] ([id number]) -------------------------------------------------------------------------------- Vitals Details Patient Name: [name] Date of Service: 07/17/2017 2:30 PM Medical Record Number: [id number] Patient Account Number: [id number] Date of Birth/Sex: May 02, 1971 (47 y.o. Male) Treating RN: Curtis Sites Primary Care Donyetta Ogletree: PATIENT, NO Other Clinician: Referring Josclyn Rosales: Janalyn Harder Treating Jakhari Space/Extender: Linwood Dibbles, HOYT Weeks in Treatment: 6 Vital Signs Time Taken: 14:35 Temperature  (F): 98.1 Height (in): 76 Pulse (bpm): 75 Source: Measured Respiratory Rate (breaths/min): 18 Weight (lbs): 235 Blood Pressure (mmHg): 167/89 Source: Measured Reference Range: 80 - 120 mg / dl Body Mass Index (BMI): 28.6 Electronic Signature(s) Signed: 07/17/2017 4:44:52 PM By: Curtis Sites Entered By: Curtis Sites on 07/17/2017 14:37:10

## 2017-07-19 NOTE — Progress Notes (Signed)
KARAS, PICKERILL ([id number]) Visit Report for 07/17/2017 Chief Complaint Document Details Patient Name: [name], [name]. Date of Service: 07/17/2017 2:30 PM Medical Record Number: [id number] Patient Account Number: [id number] Date of Birth/Sex: May 02, 1971 (47 y.o. Male) Treating RN: Curtis Sites Primary Care Provider: PATIENT, NO Other Clinician: Referring Provider: Janalyn Harder Treating Provider/Extender: Linwood Dibbles, Thanvi Blincoe Weeks in Treatment: 6 Information Obtained from: Patient Chief Complaint Patient seen for complaints of Non-Healing Wound to the distal phalanx of the left middle finger which he has had since 05/23/2017 Electronic Signature(s) Signed: 07/17/2017 5:25:11 PM By: Lenda Kelp PA-C Entered By: Lenda Kelp on 07/17/2017 14:26:47 [name] ([id number]) -------------------------------------------------------------------------------- HPI Details Patient Name: [name] Date of Service: 07/17/2017 2:30 PM Medical Record Number: [id number] Patient Account Number: [id number] Date of Birth/Sex: March 19, 1971 (47 y.o. Male) Treating RN: Curtis Sites Primary Care Provider: PATIENT, NO Other Clinician: Referring Provider: Janalyn Harder Treating Provider/Extender: Linwood Dibbles, Teal Raben Weeks in Treatment: 6 History of Present Illness HPI Description: this 47 year old young man was referred by the [employer] team at [facility] employee health and wellness at [facility] and has a injury to the left middle finger distal phalanx on 05/23/2017. He sustained a crush injury in a grinder and the distal tip of the left middle finger was ripped and torn off including the fingernail. He had an x-ray of the left hand IMPRESSION:1. Middle finger distal soft tissue laceration with loss of soft tissue to the finger tip. Subtle evidence of minimal fracturing of the distal margin of the distal tuft. No other fracture. No dislocation. No radiopaque  foreign body. the patient does not have any significant past medical history but is a smoker and smokes about a pack of cigarettes a day. The patient was prescribed Cefadroxil 500 mg twice a day for 7 days and given some dressing changes and asked to see the wound care. the patient was not referred to hand a Engineer, petroleum. 06/12/17 on evaluation today patient appears to be doing decently well in regard to the injury only distal portion of his left middle finger. He is having some discomfort unfortunately. Nonetheless he has not seen the orthopedic specialist as of yet that we referred him to. He states he really wasn't sure if that was something that was necessary or not and subsequently he also states that he has had a difficult time getting in touch with the case manager and adjuster on his case over the holiday. Nonetheless he has continued to perform the dressing changes appropriately and the wound does seem to be showing signs of improvement which is excellent news. Upon review of the x-ray it does appear that the fracture of the tuft was minimal nonetheless I do think she needs orthopedic specialist to monitor this as far as ensuring complete healing and obviously rate and releasing the patient once everything is said and done. 06/19/17 on evaluation today patient's wound on the tip of his left middle finger appears to be doing much better. The Melburn Popper has been doing its job and a lot of the slough was very loose. At the very tip he did have a little bit of slough that was more adherent. There does not appear to be any infection and he actually has an appointment with orthopedics tomorrow at [facility] for evaluation in regard to the tuft fracture. He seems to still be having some pain from likely this fracture. 06/26/17 on evaluation today patient appears to be doing excellent in regard to  his left middle finger ulcer. He has been tolerating the dressing changes without complication. [facility]. With that being said they did not see any evidence of fracture at this point according to what the patient is telling me although I do not have that note for review personally as of yet. Nonetheless it sounds like things are doing very well and the fact that there is no fracture is even better. Again this had been noted on the initial report. He really does not have any significant slough today on evaluation. 07/03/17 on evaluation today patient appears to be doing excellent in regard to his finger ulcer. He has been tolerating the dressing changes without complication and continues to have greater and greater epithelialization week by week. Overall I'm very pleased with the progress that he has made. Unfortunately we still have not gotten the report from the orthopedic specialist that he saw. We did request this last week. 07/10/17 on evaluation today patient appears to be doing excellent in regard to his left middle finger ulcer. He has been tolerating the dressing changes without complication and overall this is showing signs of excellent epithelialization much better than even last week. Overall I'm extremely pleased with the progress he has made. Patient is likewise happy and continues to work full duty in fact he tells me that he is been able to sense the second day following the entry. He is having no pain. 07/17/17 on evaluation today patient's ulcer on the left middle finger appears to be doing very well. He has been tolerating the dressing changes without complication. If anything it actually appears that there may be some dry skin around the area but there is only a very small central opening still noted at this point. Fortunately there's no evidence of infection, note purulent drainage, and patient is having no pain. Unfortunately he does have a migraine today. DRYSTAN, READER ([id number]) Electronic Signature(s) Signed: 07/17/2017  5:25:11 PM By: Lenda Kelp PA-C Entered By: Lenda Kelp on 07/17/2017 16:59:33 Clide, Remmers Vincent Peyer ([id number]) -------------------------------------------------------------------------------- Physical Exam Details Patient Name: AMAURI, KEEFE. Date of Service: 07/17/2017 2:30 PM Medical Record Number: [id number] Patient Account Number: [id number] Date of Birth/Sex: Dec 09, 1970 (47 y.o. Male) Treating RN: Curtis Sites Primary Care Provider: PATIENT, NO Other Clinician: Referring Provider: Janalyn Harder Treating Provider/Extender: STONE III, Emmarie Sannes Weeks in Treatment: 6 Constitutional Well-nourished and well-hydrated in no acute distress. Respiratory normal breathing without difficulty. clear to auscultation bilaterally. Cardiovascular regular rate and rhythm with normal S1, S2. Psychiatric this patient is able to make decisions and demonstrates good insight into disease process. Alert and Oriented x 3. pleasant and cooperative. Notes Currently patient seems to be doing very well in regard to his left middle finger ulceration. There is some slight eschar/dry skin surrounding the wound bed although the wound bed itself appears to be extremely small that's good news. No debridement was required today. I am going to recommend that he focus finger with dial soap for some time to clean all some of the dry skin. Electronic Signature(s) Signed: 07/17/2017 5:25:11 PM By: Lenda Kelp PA-C Entered By: Lenda Kelp on 07/17/2017 17:00:14 [name] ([id number]) -------------------------------------------------------------------------------- Physician Orders Details Patient Name: [name] Date of Service: 07/17/2017 2:30 PM Medical Record Number: [id number] Patient Account Number: [id number] Date of Birth/Sex: 1971-03-14 (47 y.o. Male) Treating RN: Curtis Sites Primary Care Provider: PATIENT, NO Other Clinician: Referring Provider: Janalyn Harder Treating  Provider/Extender: Linwood Dibbles, Santonio Speakman Weeks in Treatment: 6 Verbal / Phone Orders: No Diagnosis Coding ICD-10 Coding Code Description S61.303A Unspecified open wound of left middle finger with damage to nail, initial encounter S62.663B Nondisplaced fracture of distal phalanx of left middle finger, initial encounter for open fracture F17.218 Nicotine dependence, cigarettes, with other nicotine-induced disorders Wound Cleansing Wound #1 Left Hand - 3rd Digit o Clean wound with Normal Saline. o Cleanse wound with mild soap and water o May Shower, gently pat wound dry prior to applying new dressing. Anesthetic (add to Medication List) Wound #1 Left Hand - 3rd Digit o Topical Lidocaine 4% cream applied to wound bed prior to debridement (In Clinic Only). Primary Wound Dressing Wound #1 Left Hand - 3rd Digit o Prisma Ag - moisten with saline Secondary Dressing Wound #1 Left Hand - 3rd Digit o Conform/Kerlix - non-stick pad o Other - netting #2 Dressing Change Frequency Wound #1 Left Hand - 3rd Digit o Change dressing every day. Follow-up Appointments Wound #1 Left Hand - 3rd Digit o Return Appointment in 1 week. Additional Orders / Instructions Wound #1 Left Hand - 3rd Digit o Stop Smoking o Vitamin A; Vitamin C, Zinc - over the counter supplements o Increase protein intake. CLEMENTE, DEWEY ([id number]) Electronic Signature(s) Signed: 07/17/2017 4:44:52 PM By: Curtis Sites Signed: 07/17/2017 5:25:11 PM By: Lenda Kelp PA-C Entered By: Curtis Sites on 07/17/2017 14:49:25 [name] ([id number]) -------------------------------------------------------------------------------- Problem List Details Patient Name: AFNAN, EMBERTON. Date of Service: 07/17/2017 2:30 PM Medical Record Number: [id number] Patient Account Number: [id number] Date of Birth/Sex: Jun 24, 1970 (47 y.o. Male) Treating RN: Curtis Sites Primary Care Provider: PATIENT, NO Other  Clinician: Referring Provider: Janalyn Harder Treating Provider/Extender: Linwood Dibbles, Gavriela Cashin Weeks in Treatment: 6 Active Problems ICD-10 Encounter Code Description Active Date Diagnosis S61.303A Unspecified open wound of left middle finger with damage to nail, 06/02/2017 Yes initial encounter S62.663B Nondisplaced fracture of distal phalanx of left middle finger, initial 06/02/2017 Yes encounter for open fracture F17.218 Nicotine dependence, cigarettes, with other nicotine-induced 06/02/2017 Yes disorders Inactive Problems Resolved Problems Electronic Signature(s) Signed: 07/17/2017 5:25:11 PM By: Lenda Kelp PA-C Entered By: Lenda Kelp on 07/17/2017 14:26:41 Milne, Vincent Peyer ([id number]) -------------------------------------------------------------------------------- Progress Note Details Patient Name: [name] Date of Service: 07/17/2017 2:30 PM Medical Record Number: [id number] Patient Account Number: [id number] Date of Birth/Sex: 04-02-1971 (46 y.o. Male) Treating RN: Curtis Sites Primary Care Provider: PATIENT, NO Other Clinician: Referring Provider: Janalyn Harder Treating Provider/Extender: Linwood Dibbles, Riyad Keena Weeks in Treatment: 6 Subjective Chief Complaint Information obtained from Patient Patient seen for complaints of Non-Healing Wound to the distal phalanx of the left middle finger which he has had since 05/23/2017 History of Present Illness (HPI) this 47 year old young man was referred by the [employer] team at [facility] employee health and wellness at [facility] and has a injury to the left middle finger distal phalanx on 05/23/2017. He sustained a crush injury in a grinder and the distal tip of the left middle finger was ripped and torn off including the fingernail. He had an x-ray of the left hand IMPRESSION:1. Middle finger distal soft tissue laceration with loss of soft tissue to the finger tip. Subtle evidence  of minimal fracturing of the distal margin of the distal tuft. No other fracture. No dislocation. No radiopaque foreign body. the patient does not have any significant past medical history but is a smoker and smokes about a pack of cigarettes a day. The patient  was prescribed Cefadroxil 500 mg twice a day for 7 days and given some dressing changes and asked to see the wound care. the patient was not referred to hand a Engineer, petroleumplastic surgeon. 06/12/17 on evaluation today patient appears to be doing decently well in regard to the injury only distal portion of his left middle finger. He is having some discomfort unfortunately. Nonetheless he has not seen the orthopedic specialist as of yet that we referred him to. He states he really wasn't sure if that was something that was necessary or not and subsequently he also states that he has had a difficult time getting in touch with the case manager and adjuster on his case over the holiday. Nonetheless he has continued to perform the dressing changes appropriately and the wound does seem to be showing signs of improvement which is excellent news. Upon review of the x-ray it does appear that the fracture of the tuft was minimal nonetheless I do think she needs orthopedic specialist to monitor this as far as ensuring complete healing and obviously rate and releasing the patient once everything is said and done. 06/19/17 on evaluation today patient's wound on the tip of his left middle finger appears to be doing much better. The Melburn PopperSantyl has been doing its job and a lot of the slough was very loose. At the very tip he did have a little bit of slough that was more adherent. There does not appear to be any infection and he actually has [facility] in regard to the tuft fracture. He seems to still be having some pain from likely this fracture. 06/26/17 on evaluation today patient appears to be doing excellent in  regard to his left middle finger ulcer. He has been tolerating the dressing changes without complication. [facility] the [facility]. With that being said they did not see any evidence of fracture at this point according to what the patient is telling me although I do not have that note for review personally as of yet. Nonetheless it sounds like things are doing very well and the fact that there is no fracture is even better. Again this had been noted on the initial report. He really does not have any significant slough today on evaluation. 07/03/17 on evaluation today patient appears to be doing excellent in regard to his finger ulcer. He has been tolerating the dressing changes without complication and continues to have greater and greater epithelialization week by week. Overall I'm very pleased with the progress that he has made. Unfortunately we still have not gotten the report from the orthopedic specialist that he saw. We did request this last week. 07/10/17 on evaluation today patient appears to be doing excellent in regard to his left middle finger ulcer. He has been tolerating the dressing changes without complication and overall this is showing signs of excellent epithelialization much better than even last week. Overall I'm extremely pleased with the progress he has made. Patient is likewise happy and continues to work full duty in fact he tells me that he is been able to sense the second day following the entry. He is having no Cadenas, Nyzir K. ([id number]) pain. 07/17/17 on evaluation today patient's ulcer on the left middle finger appears to be doing very well. He has been tolerating the dressing changes without complication. If anything it actually appears that there may be some dry skin around the area but there is only  a very small central opening still noted at this point. Fortunately there's no evidence of infection, note purulent drainage, and patient  is having no pain. Unfortunately he does have a migraine today. Patient History Information obtained from Patient. Family History No family history of Cancer, Diabetes, Heart Disease, Hereditary Spherocytosis, Hypertension, Kidney Disease, Lung Disease, Seizures, Stroke, Thyroid Problems, Tuberculosis. Social History Current every day smoker - 1/2 pack to 1 pack, Marital Status - Single, Alcohol Use - Rarely, Drug Use - Current History - marijana occasionally, Caffeine Use - Daily. Review of Systems (ROS) Constitutional Symptoms (General Health) Denies complaints or symptoms of Fever, Chills. Respiratory The patient has no complaints or symptoms. Cardiovascular The patient has no complaints or symptoms. Psychiatric The patient has no complaints or symptoms. Objective Constitutional Well-nourished and well-hydrated in no acute distress. Vitals Time Taken: 2:35 PM, Height: 76 in, Source: Measured, Weight: 235 lbs, Source: Measured, BMI: 28.6, Temperature: 98.1 F, Pulse: 75 bpm, Respiratory Rate: 18 breaths/min, Blood Pressure: 167/89 mmHg. Respiratory normal breathing without difficulty. clear to auscultation bilaterally. Cardiovascular regular rate and rhythm with normal S1, S2. Psychiatric this patient is able to make decisions and demonstrates good insight into disease process. Alert and Oriented x 3. pleasant and cooperative. General Notes: Currently patient seems to be doing very well in regard to his left middle finger ulceration. There is some slight eschar/dry skin surrounding the wound bed although the wound bed itself appears to be extremely small that's good news. No BRAELON, SPRUNG. ([id number]) debridement was required today. I am going to recommend that he focus finger with dial soap for some time to clean all some of the dry skin. Integumentary (Hair, Skin) Wound #1 status is Open. Original cause of wound was Trauma. The wound is located on the Left Hand - 3rd  Digit. The wound measures 0.7cm length x 0.2cm width x 0.1cm depth; 0.11cm^2 area and 0.011cm^3 volume. There is Fat Layer (Subcutaneous Tissue) Exposed exposed. There is no tunneling or undermining noted. There is a small amount of serous drainage noted. The wound margin is flat and intact. There is large (67-100%) red granulation within the wound bed. There is a small (1-33%) amount of necrotic tissue within the wound bed including Adherent Slough. The periwound skin appearance exhibited: Excoriation. The periwound skin appearance did not exhibit: Callus, Crepitus, Induration, Rash, Scarring, Dry/Scaly, Maceration, Atrophie Blanche, Cyanosis, Ecchymosis, Hemosiderin Staining, Mottled, Pallor, Rubor, Erythema. Periwound temperature was noted as No Abnormality. The periwound has tenderness on palpation. Assessment Active Problems ICD-10 S61.303A - Unspecified open wound of left middle finger with damage to nail, initial encounter S62.663B - Nondisplaced fracture of distal phalanx of left middle finger, initial encounter for open fracture F17.218 - Nicotine dependence, cigarettes, with other nicotine-induced disorders Plan Wound Cleansing: Wound #1 Left Hand - 3rd Digit: Clean wound with Normal Saline. Cleanse wound with mild soap and water May Shower, gently pat wound dry prior to applying new dressing. Anesthetic (add to Medication List): Wound #1 Left Hand - 3rd Digit: Topical Lidocaine 4% cream applied to wound bed prior to debridement (In Clinic Only). Primary Wound Dressing: Wound #1 Left Hand - 3rd Digit: Prisma Ag - moisten with saline Secondary Dressing: Wound #1 Left Hand - 3rd Digit: Conform/Kerlix - non-stick pad Other - netting #2 Dressing Change Frequency: Wound #1 Left Hand - 3rd Digit: Change dressing every day. Follow-up Appointments: Wound #1 Left Hand - 3rd Digit: Return Appointment in 1 week. Additional Orders / Instructions: Wound #1 Left Hand -  3rd  Digit: Stop Smoking Vitamin A; Vitamin C, Zinc - over the counter supplements Increase protein intake. SIRAJ, DERMODY ([id number]) We will see patient for reevaluation in one weeks time to see were things stand at that point. He's in agreement with plan. Hopefully this will be completely sealed and healed at that point. If not we will see were things do stand. Otherwise I did suggest dial soap soaks to be performed daily in order to loosen up some of the dry skin and eschar so that hopefully the new skin will not be impeded in any way as far as closure is concerned. He's in agreement with the plan. Please see above for specific wound care orders. We will see patient for re-evaluation in 1 week(s) here in the clinic. If anything worsens or changes patient will contact our office for additional recommendations. Electronic Signature(s) Signed: 07/17/2017 5:25:11 PM By: Lenda Kelp PA-C Entered By: Lenda Kelp on 07/17/2017 17:01:00 [name] ([id number]) -------------------------------------------------------------------------------- ROS/PFSH Details Patient Name: [name] Date of Service: 07/17/2017 2:30 PM Medical Record Number: [id number] Patient Account Number: [id number] Date of Birth/Sex: 08/31/1970 (47 y.o. Male) Treating RN: Curtis Sites Primary Care Provider: PATIENT, NO Other Clinician: Referring Provider: Janalyn Harder Treating Provider/Extender: Linwood Dibbles, Jhoanna Heyde Weeks in Treatment: 6 Information Obtained From Patient Wound History Do you currently have one or more open woundso Yes How many open wounds do you currently haveo 1 Approximately how long have you had your woundso 10 days How have you been treating your wound(s) until nowo dry bandage Has your wound(s) ever healed and then re-openedo No Have you had any lab work done in the past montho No Have you tested positive for an antibiotic resistant organism (MRSA, VRE)o No Have you tested  positive for osteomyelitis (bone infection)o No Have you had any tests for circulation on your legso No Constitutional Symptoms (General Health) Complaints and Symptoms: Negative for: Fever; Chills Hematologic/Lymphatic Medical History: Negative for: Anemia; Hemophilia; Human Immunodeficiency Virus; Lymphedema; Sickle Cell Disease Respiratory Complaints and Symptoms: No Complaints or Symptoms Medical History: Negative for: Aspiration; Asthma; Chronic Obstructive Pulmonary Disease (COPD); Pneumothorax; Sleep Apnea; Tuberculosis Cardiovascular Complaints and Symptoms: No Complaints or Symptoms Medical History: Negative for: Angina; Arrhythmia; Congestive Heart Failure; Coronary Artery Disease; Deep Vein Thrombosis; Hypertension; Hypotension; Myocardial Infarction; Peripheral Arterial Disease; Peripheral Venous Disease; Phlebitis; Vasculitis Gastrointestinal Medical History: Negative for: Cirrhosis ; Colitis; Crohnos; Hepatitis A; Hepatitis B; Hepatitis C Endocrine KWEKU, STANKEY ([id number]) Medical History: Negative for: Type I Diabetes; Type II Diabetes Genitourinary Medical History: Negative for: End Stage Renal Disease Immunological Medical History: Negative for: Lupus Erythematosus; Raynaudos; Scleroderma Integumentary (Skin) Medical History: Negative for: History of Burn; History of pressure wounds Musculoskeletal Medical History: Negative for: Gout; Rheumatoid Arthritis; Osteoarthritis; Osteomyelitis Neurologic Medical History: Negative for: Dementia; Neuropathy; Quadriplegia; Paraplegia; Seizure Disorder Oncologic Medical History: Negative for: Received Chemotherapy; Received Radiation Psychiatric Complaints and Symptoms: No Complaints or Symptoms Medical History: Negative for: Anorexia/bulimia; Confinement Anxiety Immunizations Pneumococcal Vaccine: Received Pneumococcal Vaccination: Yes Tetanus Vaccine: Last tetanus shot: 05/23/2017 Implantable  Devices Family and Social History Cancer: No; Diabetes: No; Heart Disease: No; Hereditary Spherocytosis: No; Hypertension: No; Kidney Disease: No; Lung Disease: No; Seizures: No; Stroke: No; Thyroid Problems: No; Tuberculosis: No; Current every day smoker - 1/2 pack to 1 pack; Marital Status - Single; Alcohol Use: Rarely; Drug Use: Current History - marijana occasionally; Caffeine Use: Daily; Financial Concerns: No; Food, Clothing or Shelter Needs: No; Support System Lacking: No; Transportation  Concerns: No; Advanced Directives: No; Patient does not want information on Advanced Directives; Do not resuscitate: No; Living Will: No; Medical Power of Attorney: No Physician Affirmation I have reviewed and agree with the above information. ORYON, GARY ([id number]) Electronic Signature(s) Signed: 07/17/2017 5:25:11 PM By: Lenda Kelp PA-C Signed: 07/18/2017 4:06:53 PM By: Curtis Sites Entered By: Lenda Kelp on 07/17/2017 16:59:52 JASUN, GASPARINI ([id number]) -------------------------------------------------------------------------------- SuperBill Details Patient Name: [name] Date of Service: 07/17/2017 Medical Record Number: [id number] Patient Account Number: [id number] Date of Birth/Sex: 10-22-1970 (47 y.o. Male) Treating RN: Curtis Sites Primary Care Provider: PATIENT, NO Other Clinician: Referring Provider: Janalyn Harder Treating Provider/Extender: Linwood Dibbles, Claira Jeter Weeks in Treatment: 6 Diagnosis Coding ICD-10 Codes Code Description S61.303A Unspecified open wound of left middle finger with damage to nail, initial encounter S62.663B Nondisplaced fracture of distal phalanx of left middle finger, initial encounter for open fracture F17.218 Nicotine dependence, cigarettes, with other nicotine-induced disorders Facility Procedures CPT4 Code: 40102725 Description: 36644 - WOUND CARE VISIT-LEV 2 EST PT Modifier: Quantity: 1 Physician Procedures CPT4:  Description Modifier Quantity Code 0347425 99213 - WC PHYS LEVEL 3 - EST PT 1 ICD-10 Diagnosis Description S61.303A Unspecified open wound of left middle finger with damage to nail, initial encounter S62.663B Nondisplaced fracture of distal phalanx  of left middle finger, initial encounter for open fracture F17.218 Nicotine dependence, cigarettes, with other nicotine-induced disorders Electronic Signature(s) Signed: 07/17/2017 5:25:11 PM By: Lenda Kelp PA-C Entered By: Lenda Kelp on 07/17/2017 17:01:16

## 2017-07-24 ENCOUNTER — Encounter: Payer: Worker's Compensation | Admitting: Physician Assistant

## 2017-07-24 DIAGNOSIS — S61303A Unspecified open wound of left middle finger with damage to nail, initial encounter: Secondary | ICD-10-CM | POA: Diagnosis not present

## 2017-07-25 NOTE — Progress Notes (Signed)
WACO, FOERSTER ([id number]) Visit Report for 07/24/2017 Arrival Information Details Patient Name: [name], [name]. Date of Service: 07/24/2017 2:30 PM Medical Record Number: [id number] Patient Account Number: [id number] Date of Birth/Sex: 04/21/1971 (47 y.o. Male) Treating RN: Huel Coventry Primary Care Xadrian Craighead: PATIENT, NO Other Clinician: Referring Maxum Cassarino: Janalyn Harder Treating Cailie Bosshart/Extender: Linwood Dibbles, HOYT Weeks in Treatment: 7 Visit Information History Since Last Visit Added or deleted any medications: No Patient Arrived: Ambulatory Any new allergies or adverse reactions: No Arrival Time: 14:26 Had a fall or experienced change in No Accompanied By: self activities of daily living that may affect Transfer Assistance: None risk of falls: Patient Identification Verified: Yes Signs or symptoms of abuse/neglect since last visito No Secondary Verification Process Completed: Yes Hospitalized since last visit: No Has Dressing in Place as Prescribed: Yes Pain Present Now: No Electronic Signature(s) Signed: 07/25/2017 8:36:08 AM By: Elliot Gurney, BSN, RN, CWS, Kim RN, BSN Entered By: Elliot Gurney, BSN, RN, CWS, Kim on 07/24/2017 14:28:28 [name] ([id number]) -------------------------------------------------------------------------------- Clinic Level of Care Assessment Details Patient Name: [name], [name]. Date of Service: 07/24/2017 2:30 PM Medical Record Number: [id number] Patient Account Number: [id number] Date of Birth/Sex: March 24, 1971 (47 y.o. Male) Treating RN: Huel Coventry Primary Care Taggert Bozzi: PATIENT, NO Other Clinician: Referring Renne Platts: Janalyn Harder Treating Zoya Sprecher/Extender: Linwood Dibbles, HOYT Weeks in Treatment: 7 Clinic Level of Care Assessment Items TOOL 4 Quantity Score []  - Use when only an EandM is performed on FOLLOW-UP visit 0 ASSESSMENTS - Nursing Assessment / Reassessment []  - Reassessment of Co-morbidities (includes updates in patient status) 0 X- 1  5 Reassessment of Adherence to Treatment Plan ASSESSMENTS - Wound and Skin Assessment / Reassessment X - Simple Wound Assessment / Reassessment - one wound 1 5 []  - 0 Complex Wound Assessment / Reassessment - multiple wounds []  - 0 Dermatologic / Skin Assessment (not related to wound area) ASSESSMENTS - Focused Assessment []  - Circumferential Edema Measurements - multi extremities 0 []  - 0 Nutritional Assessment / Counseling / Intervention []  - 0 Lower Extremity Assessment (monofilament, tuning fork, pulses) []  - 0 Peripheral Arterial Disease Assessment (using hand held doppler) ASSESSMENTS - Ostomy and/or Continence Assessment and Care []  - Incontinence Assessment and Management 0 []  - 0 Ostomy Care Assessment and Management (repouching, etc.) PROCESS - Coordination of Care X - Simple Patient / Family Education for ongoing care 1 15 []  - 0 Complex (extensive) Patient / Family Education for ongoing care []  - 0 Staff obtains Chiropractor, Records, Test Results / Process Orders []  - 0 Staff telephones HHA, Nursing Homes / Clarify orders / etc []  - 0 Routine Transfer to another Facility (non-emergent condition) []  - 0 Routine Hospital Admission (non-emergent condition) []  - 0 New Admissions / Manufacturing engineer / Ordering NPWT, Apligraf, etc. []  - 0 Emergency Hospital Admission (emergent condition) X- 1 10 Simple Discharge Coordination TRA, WILEMON. ([id number]) []  - 0 Complex (extensive) Discharge Coordination PROCESS - Special Needs []  - Pediatric / Minor Patient Management 0 []  - 0 Isolation Patient Management []  - 0 Hearing / Language / Visual special needs []  - 0 Assessment of Community assistance (transportation, D/C planning, etc.) []  - 0 Additional assistance / Altered mentation []  - 0 Support Surface(s) Assessment (bed, cushion, seat, etc.) INTERVENTIONS - Wound Cleansing / Measurement X - Simple Wound Cleansing - one wound 1 5 []  - 0 Complex Wound  Cleansing - multiple wounds X- 1 5 Wound Imaging (photographs - any number of wounds) []  - 0 Wound Tracing (instead  of photographs) X- 1 5 Simple Wound Measurement - one wound []  - 0 Complex Wound Measurement - multiple wounds INTERVENTIONS - Wound Dressings []  - Small Wound Dressing one or multiple wounds 0 []  - 0 Medium Wound Dressing one or multiple wounds []  - 0 Large Wound Dressing one or multiple wounds []  - 0 Application of Medications - topical []  - 0 Application of Medications - injection INTERVENTIONS - Miscellaneous []  - External ear exam 0 []  - 0 Specimen Collection (cultures, biopsies, blood, body fluids, etc.) []  - 0 Specimen(s) / Culture(s) sent or taken to Lab for analysis []  - 0 Patient Transfer (multiple staff / Nurse, adult / Similar devices) []  - 0 Simple Staple / Suture removal (25 or less) []  - 0 Complex Staple / Suture removal (26 or more) []  - 0 Hypo / Hyperglycemic Management (close monitor of Blood Glucose) []  - 0 Ankle / Brachial Index (ABI) - do not check if billed separately X- 1 5 Vital Signs [name], Hunter K. ([id number]) Has the patient been seen at the hospital within the last three years: Yes Total Score: 55 Level Of Care: New/Established - Level 2 Electronic Signature(s) Signed: 07/25/2017 8:36:08 AM By: Elliot Gurney, BSN, RN, CWS, Kim RN, BSN Entered By: Elliot Gurney, BSN, RN, CWS, Kim on 07/24/2017 14:41:00 [name] ([id number]) -------------------------------------------------------------------------------- Encounter Discharge Information Details Patient Name: [name], [name]. Date of Service: 07/24/2017 2:30 PM Medical Record Number: [id number] Patient Account Number: [id number] Date of Birth/Sex: 1970/10/09 (47 y.o. Male) Treating RN: Huel Coventry Primary Care Aquinnah Devin: PATIENT, NO Other Clinician: Referring Aydan Levitz: Janalyn Harder Treating Neena Beecham/Extender: Linwood Dibbles, HOYT Weeks in Treatment: 7 Encounter Discharge Information  Items Discharge Pain Level: 0 Discharge Condition: Stable Ambulatory Status: Ambulatory Discharge Destination: Home Transportation: Private Auto Accompanied By: self Schedule Follow-up Appointment: Yes Medication Reconciliation completed and Yes provided to Patient/Care Jasiah Elsen: Provided on Clinical Summary of Care: 07/24/2017 Form Type Recipient Paper Patient TS Electronic Signature(s) Signed: 07/24/2017 4:58:43 PM By: Gwenlyn Perking Entered By: Gwenlyn Perking on 07/24/2017 14:44:29 [name] ([id number]) -------------------------------------------------------------------------------- Lower Extremity Assessment Details Patient Name: [name]. Date of Service: 07/24/2017 2:30 PM Medical Record Number: [id number] Patient Account Number: [id number] Date of Birth/Sex: Jun 13, 1970 (47 y.o. Male) Treating RN: Huel Coventry Primary Care Ranulfo Kall: PATIENT, NO Other Clinician: Referring Treyce Spillers: Janalyn Harder Treating Cranston Koors/Extender: Lenda Kelp Weeks in Treatment: 7 Electronic Signature(s) Signed: 07/25/2017 8:36:08 AM By: Elliot Gurney, BSN, RN, CWS, Kim RN, BSN Entered By: Elliot Gurney, BSN, RN, CWS, Kim on 07/24/2017 14:39:02 [name] ([id number]) -------------------------------------------------------------------------------- Multi Wound Chart Details Patient Name: [name] Date of Service: 07/24/2017 2:30 PM Medical Record Number: [id number] Patient Account Number: [id number] Date of Birth/Sex: 11-Apr-1971 (47 y.o. Male) Treating RN: Huel Coventry Primary Care Maddux Vanscyoc: PATIENT, NO Other Clinician: Referring Kasyn Stouffer: Janalyn Harder Treating Quandra Fedorchak/Extender: Linwood Dibbles, HOYT Weeks in Treatment: 7 Vital Signs Height(in): 76 Pulse(bpm): 74 Weight(lbs): 235 Blood Pressure(mmHg): 146/92 Body Mass Index(BMI): 29 Temperature(F): 98.1 Respiratory Rate 16 (breaths/min): Photos: [1:No Photos] [N/A:N/A] Wound Location: [1:Left Hand - 3rd Digit]  [N/A:N/A] Wounding Event: [1:Trauma] [N/A:N/A] Primary Etiology: [1:Trauma, Other] [N/A:N/A] Date Acquired: [1:05/23/2017] [N/A:N/A] Weeks of Treatment: [1:7] [N/A:N/A] Wound Status: [1:Healed - Epithelialized] [N/A:N/A] Pending Amputation on [1:Yes] [N/A:N/A] Presentation: Measurements L x W x D [1:0x0x0] [N/A:N/A] (cm) Area (cm) : [1:0] [N/A:N/A] Volume (cm) : [1:0] [N/A:N/A] % Reduction in Area: [1:100.00%] [N/A:N/A] % Reduction in Volume: [1:100.00%] [N/A:N/A] Classification: [1:Full Thickness With Exposed Support Structures] [N/A:N/A] Periwound Skin Texture: [1:No Abnormalities Noted] [N/A:N/A] Periwound  Skin Moisture: [1:No Abnormalities Noted] [N/A:N/A] Periwound Skin Color: [1:No Abnormalities Noted No] [N/A:N/A N/A] Treatment Notes Electronic Signature(s) Signed: 07/25/2017 8:36:08 AM By: Elliot GurneyWoody, BSN, RN, CWS, Kim RN, BSN Entered By: Elliot GurneyWoody, BSN, RN, CWS, Kim on 07/24/2017 14:39:45 Hunter ClarksSTACEY, Hunter K. ([id number]) -------------------------------------------------------------------------------- Multi-Disciplinary Care Plan Details Patient Name: Hunter ClarksSTACEY, Hunter K. Date of Service: 07/24/2017 2:30 PM Medical Record Number: [id number] Patient Account Number: [id number] Date of Birth/Sex: 06/24/1970 [age] y.o. Male) Treating RN: Huel CoventryWoody, Kim Primary Care Tavita Eastham: PATIENT, NO Other Clinician: Referring Theon Sobotka: Janalyn HarderSINGER, SAMANTHA Treating Mattheo Swindle/Extender: Lenda KelpSTONE III, HOYT Weeks in Treatment: 7 Active Inactive Electronic Signature(s) Signed: 07/25/2017 8:36:08 AM By: Elliot GurneyWoody, BSN, RN, CWS, Kim RN, BSN Entered By: Elliot GurneyWoody, BSN, RN, CWS, Kim on 07/24/2017 14:39:36 Hunter ClarksSTACEY, Hunter K. ([id number]) -------------------------------------------------------------------------------- Pain Assessment Details Patient Name: Hunter ClarksSTACEY, Hunter K. Date of Service: 07/24/2017 2:30 PM Medical Record Number: [id number] Patient Account Number: [id number] Date of Birth/Sex: 04/25/1971 [age] y.o. Male) Treating RN:  Huel CoventryWoody, Kim Primary Care Luan Urbani: PATIENT, NO Other Clinician: Referring Itza Maniaci: Janalyn HarderSINGER, SAMANTHA Treating Mariangela Heldt/Extender: Linwood DibblesSTONE III, HOYT Weeks in Treatment: 7 Active Problems Location of Pain Severity and Description of Pain Patient Has Paino No Site Locations With Dressing Change: No Pain Management and Medication Current Pain Management: Goals for Pain Management Topical or injectable lidocaine is offered to patient for acute pain when surgical debridement is performed. If needed, Patient is instructed to use over the counter pain medication for the following 24-48 hours after debridement. Wound care MDs do not prescribed pain medications. Patient has chronic pain or uncontrolled pain. Patient has been instructed to make an appointment with their Primary Care Physician for pain management. Electronic Signature(s) Signed: 07/25/2017 8:36:08 AM By: Elliot GurneyWoody, BSN, RN, CWS, Kim RN, BSN Entered By: Elliot GurneyWoody, BSN, RN, CWS, Kim on 07/24/2017 14:28:37 Hunter ClarksSTACEY, Hunter K. ([id number]) -------------------------------------------------------------------------------- Patient/Caregiver Education Details Patient Name: Hunter ClarksSTACEY, Hunter K. Date of Service: 07/24/2017 2:30 PM Medical Record Number: [id number] Patient Account Number: [id number] Date of Birth/Gender: 05/22/1971 [age] y.o. Male) Treating RN: Huel CoventryWoody, Kim Primary Care Physician: PATIENT, NO Other Clinician: Referring Physician: Janalyn HarderSINGER, SAMANTHA Treating Physician/Extender: Skeet SimmerSTONE III, HOYT Weeks in Treatment: 7 Education Assessment Education Provided To: Patient Education Topics Provided Wound/Skin Impairment: Handouts: Other: follow-up with ortho Methods: Demonstration, Explain/Verbal Responses: State content correctly Electronic Signature(s) Signed: 07/25/2017 8:36:08 AM By: Elliot GurneyWoody, BSN, RN, CWS, Kim RN, BSN Entered By: Elliot GurneyWoody, BSN, RN, CWS, Kim on 07/24/2017 14:42:24 Hunter ClarksSTACEY, Hunter K.  ([id number]) -------------------------------------------------------------------------------- Wound Assessment Details Patient Name: Hunter ClarksSTACEY, Enis K. Date of Service: 07/24/2017 2:30 PM Medical Record Number: [id number] Patient Account Number: [id number] Date of Birth/Sex: 08/11/1970 [age] y.o. Male) Treating RN: Huel CoventryWoody, Kim Primary Care Ramces Shomaker: PATIENT, NO Other Clinician: Referring Izzabelle Bouley: Janalyn HarderSINGER, SAMANTHA Treating Keelin Sheridan/Extender: Linwood DibblesSTONE III, HOYT Weeks in Treatment: 7 Wound Status Wound Number: 1 Primary Etiology: Trauma, Other Wound Location: Left Hand - 3rd Digit Wound Status: Healed - Epithelialized Wounding Event: Trauma Date Acquired: 05/23/2017 Weeks Of Treatment: 7 Clustered Wound: No Pending Amputation On Presentation Photos Photo Uploaded By: Elliot GurneyWoody, BSN, RN, CWS, Kim on 07/24/2017 17:01:50 Wound Measurements Length: (cm) 0 % Width: (cm) 0 % Depth: (cm) 0 Area: (cm) 0 Volume: (cm) 0 Reduction in Area: 100% Reduction in Volume: 100% Wound Description Full Thickness With Exposed Support Classification: Structures Periwound Skin Texture Texture Color No Abnormalities Noted: No No Abnormalities Noted: No Moisture No Abnormalities Noted: No Electronic Signature(s) Signed: 07/25/2017 8:36:08 AM By: Elliot GurneyWoody, BSN, RN, CWS, Kim RN, BSN Entered By: Elliot GurneyWoody, BSN, RN, CWS, Kim on 07/24/2017 14:38:53 Mcgirr, Maisie FusHOMAS  Kirtland Bouchard ([id number]) -------------------------------------------------------------------------------- Vitals Details Patient Name: MALAHKI, GASAWAY. Date of Service: 07/24/2017 2:30 PM Medical Record Number: [id number] Patient Account Number: [id number] Date of Birth/Sex: 05-25-1971 (47 y.o. Male) Treating RN: Huel Coventry Primary Care Thijs Brunton: PATIENT, NO Other Clinician: Referring Mercedes Fort: Janalyn Harder Treating Chrysta Fulcher/Extender: Linwood Dibbles, HOYT Weeks in Treatment: 7 Vital Signs Time Taken: 14:28 Temperature (F): 98.1 Height (in): 76 Pulse (bpm):  74 Weight (lbs): 235 Respiratory Rate (breaths/min): 16 Body Mass Index (BMI): 28.6 Blood Pressure (mmHg): 146/92 Reference Range: 80 - 120 mg / dl Notes Patient advised to follow up with PCP regarding elevated BP. Electronic Signature(s) Signed: 07/25/2017 8:36:08 AM By: Elliot Gurney, BSN, RN, CWS, Kim RN, BSN Entered By: Elliot Gurney, BSN, RN, CWS, Kim on 07/24/2017 14:29:56

## 2017-07-25 NOTE — Progress Notes (Addendum)
[name], Hunter K. ([id number]) Visit Report for 07/24/2017 Chief Complaint Document Details Patient Name: [name], Hunter K. Date of Service: 07/24/2017 2:30 PM Medical Record Number: [id number] Patient Account Number: [id number] Date of Birth/Sex: 11/07/1970 44(46 y.o. Male) Treating RN: Huel CoventryWoody, Kim Primary Care Provider: PATIENT, NO Other Clinician: Referring Provider: Janalyn HarderSINGER, SAMANTHA Treating Provider/Extender: Linwood DibblesSTONE III, Fredrico Beedle Weeks in Treatment: 7 Information Obtained from: Patient Chief Complaint Patient seen for complaints of Non-Healing Wound to the distal phalanx of the left middle finger which he has had since 05/23/2017 Electronic Signature(s) Signed: 07/24/2017 5:42:41 PM By: Lenda KelpStone III, Sorayah Schrodt PA-C Entered By: Lenda KelpStone III, Jami Bogdanski on 07/24/2017 14:46:17 [name], Hunter K. ([id number]) -------------------------------------------------------------------------------- HPI Details Patient Name: [name], Anothy K. Date of Service: 07/24/2017 2:30 PM Medical Record Number: [id number] Patient Account Number: [id number] Date of Birth/Sex: 03/06/1971 [age] y.o. Male) Treating RN: Huel CoventryWoody, Kim Primary Care Provider: PATIENT, NO Other Clinician: Referring Provider: Janalyn HarderSINGER, SAMANTHA Treating Provider/Extender: Linwood DibblesSTONE III, Lorieann Argueta Weeks in Treatment: 7 History of Present Illness HPI Description: this 47 year old young man was referred by the [employer] team at [facility] employee health and wellness [facility] and has a injury to the left middle finger distal phalanx on 05/23/2017. He sustained a crush injury in a grinder and the distal tip of the left middle finger was ripped and torn off including the fingernail. He had an x-ray of the left hand IMPRESSION:1. Middle finger distal soft tissue laceration with loss of soft tissue to the finger tip. Subtle evidence of minimal fracturing of the distal margin of the distal tuft. No other fracture. No dislocation. No radiopaque foreign  body. the patient does not have any significant past medical history but is a smoker and smokes about a pack of cigarettes a day. The patient was prescribed Cefadroxil 500 mg twice a day for 7 days and given some dressing changes and asked to see the wound care. the patient was not referred to hand a Engineer, petroleumplastic surgeon. 06/12/17 on evaluation today patient appears to be doing decently well in regard to the injury only distal portion of his left middle finger. He is having some discomfort unfortunately. Nonetheless he has not seen the orthopedic specialist as of yet that we referred him to. He states he really wasn't sure if that was something that was necessary or not and subsequently he also states that he has had a difficult time getting in touch with the case manager and adjuster on his case over the holiday. Nonetheless he has continued to perform the dressing changes appropriately and the wound does seem to be showing signs of improvement which is excellent news. Upon review of the x-ray it does appear that the fracture of the tuft was minimal nonetheless I do think she needs orthopedic specialist to monitor this as far as ensuring complete healing and obviously rate and releasing the patient once everything is said and done. 06/19/17 on evaluation today patient's wound on the tip of his left middle finger appears to be doing much better. The Melburn PopperSantyl has been doing its job and a lot of the slough was very loose. At the very tip he did have a little bit of slough that was more adherent. There does not appear to be any infection and he actually has an appointment with [facility] in regard to the tuft fracture. He seems to still be having some pain from likely this fracture. 06/26/17 on evaluation today patient appears to be doing excellent in regard to  his left middle finger ulcer. He has been tolerating the dressing changes without complication. [facility]. With that being said they did not see any evidence of fracture at this point according to what the patient is telling me although I do not have that note for review personally as of yet. Nonetheless it sounds like things are doing very well and the fact that there is no fracture is even better. Again this had been noted on the initial report. He really does not have any significant slough today on evaluation. 07/03/17 on evaluation today patient appears to be doing excellent in regard to his finger ulcer. He has been tolerating the dressing changes without complication and continues to have greater and greater epithelialization week by week. Overall I'm very pleased with the progress that he has made. Unfortunately we still have not gotten the report from the orthopedic specialist that he saw. We did request this last week. 07/10/17 on evaluation today patient appears to be doing excellent in regard to his left middle finger ulcer. He has been tolerating the dressing changes without complication and overall this is showing signs of excellent epithelialization much better than even last week. Overall I'm extremely pleased with the progress he has made. Patient is likewise happy and continues to work full duty in fact he tells me that he is been able to sense the second day following the entry. He is having no pain. 07/17/17 on evaluation today patient's ulcer on the left middle finger appears to be doing very well. He has been tolerating the dressing changes without complication. If anything it actually appears that there may be some dry skin around the area but there is only a very small central opening still noted at this point. Fortunately there's no evidence of infection, note purulent drainage, and patient is having no pain. Unfortunately he does have a migraine today. [name], [name] ([id number]) 07/24/17 on evaluation today patient appears to be doing  fairly well in regard to his finger ulcer. In fact this appears to be completely healed today which is excellent news. He has been tolerating the dressing changes without complication. He states that when he works he continues to wrap this even though it was believed by him as well to be healed. This again is the left middle finger. He has continued to work full duty. He does have an appointment on the 21st of this month which in fact is this coming Thursday with orthopedics. Electronic Signature(s) Signed: 07/24/2017 5:42:41 PM By: Lenda Kelp PA-C Entered By: Lenda Kelp on 07/24/2017 14:46:46 [name] ([id number]) -------------------------------------------------------------------------------- Physical Exam Details Patient Name: [name], [name]. Date of Service: 07/24/2017 2:30 PM Medical Record Number: [id number] Patient Account Number: [id number] Date of Birth/Sex: Jul 28, 1970 (47 y.o. Male) Treating RN: Huel Coventry Primary Care Provider: PATIENT, NO Other Clinician: Referring Provider: Janalyn Harder Treating Provider/Extender: STONE III, Hameed Kolar Weeks in Treatment: 7 Constitutional Well-nourished and well-hydrated in no acute distress. Respiratory normal breathing without difficulty. Psychiatric this patient is able to make decisions and demonstrates good insight into disease process. Alert and Oriented x 3. pleasant and cooperative. Notes Patient's wound does show evidence of some dry skin surrounding the healed area but I was able to remove some of this safely. There was still a small portion of the dry skin which was more adherent and not easily removable in regard to the tip finger. For that reason I left this  and recommended that patient utilize lotion as well as soaks in order to help loosen this up. Subsequently I think that over time it will work itself off. Nonetheless if he starts to notice an area that is lifting up that suggestion would be to click this  carefully with fingernail clippers in order to avoid it getting pulled but at the same time not to get too close to the healed skin. He understands. Electronic Signature(s) Signed: 07/24/2017 5:42:41 PM By: Lenda Kelp PA-C Entered By: Lenda Kelp on 07/24/2017 14:48:15 [name] ([id number]) -------------------------------------------------------------------------------- Physician Orders Details Patient Name: [name] Date of Service: 07/24/2017 2:30 PM Medical Record Number: [id number] Patient Account Number: [id number] Date of Birth/Sex: 1971/05/23 (47 y.o. Male) Treating RN: Huel Coventry Primary Care Provider: PATIENT, NO Other Clinician: Referring Provider: Janalyn Harder Treating Provider/Extender: Lenda Kelp Weeks in Treatment: 7 Verbal / Phone Orders: No Diagnosis Coding Discharge From [facility] Services o Discharge from Wound Care Center - treatment compete Electronic Signature(s) Signed: 07/24/2017 5:42:41 PM By: Lenda Kelp PA-C Signed: 07/25/2017 8:36:08 AM By: Elliot Gurney, BSN, RN, CWS, Kim RN, BSN Entered By: Elliot Gurney, BSN, RN, CWS, Kim on 07/24/2017 14:40:19 [name] ([id number]) -------------------------------------------------------------------------------- Problem List Details Patient Name: [name], [name]. Date of Service: 07/24/2017 2:30 PM Medical Record Number: [id number] Patient Account Number: [id number] Date of Birth/Sex: 10/30/1970 (47 y.o. Male) Treating RN: Huel Coventry Primary Care Provider: PATIENT, NO Other Clinician: Referring Provider: Janalyn Harder Treating Provider/Extender: Linwood Dibbles, Trevione Wert Weeks in Treatment: 7 Active Problems ICD-10 Encounter Code Description Active Date Diagnosis S61.303A Unspecified open wound of left middle finger with damage to nail, 06/02/2017 Yes initial encounter S62.663B Nondisplaced fracture of distal phalanx of left middle finger, initial 06/02/2017 Yes encounter for open fracture F17.218  Nicotine dependence, cigarettes, with other nicotine-induced 06/02/2017 Yes disorders Inactive Problems Resolved Problems Electronic Signature(s) Signed: 07/24/2017 5:42:41 PM By: Lenda Kelp PA-C Entered By: Lenda Kelp on 07/24/2017 14:46:03 [name], [name] ([id number]) -------------------------------------------------------------------------------- Progress Note Details Patient Name: [name] Date of Service: 07/24/2017 2:30 PM Medical Record Number: [id number] Patient Account Number: [id number] Date of Birth/Sex: 11/21/1970 (47 y.o. Male) Treating RN: Huel Coventry Primary Care Provider: PATIENT, NO Other Clinician: Referring Provider: Janalyn Harder Treating Provider/Extender: Linwood Dibbles, Smith Mcnicholas Weeks in Treatment: 7 Subjective Chief Complaint Information obtained from Patient Patient seen for complaints of Non-Healing Wound to the distal phalanx of the left middle finger which he has had since 05/23/2017 History of Present Illness (HPI) this 47 year old young man was referred by the [employer] team at [facility] employee health and wellness at [facility] and has a injury to the left middle finger distal phalanx on 05/23/2017. He sustained a crush injury in a grinder and the distal tip of the left middle finger was ripped and torn off including the fingernail. He had an x-ray of the left hand IMPRESSION:1. Middle finger distal soft tissue laceration with loss of soft tissue to the finger tip. Subtle evidence of minimal fracturing of the distal margin of the distal tuft. No other fracture. No dislocation. No radiopaque foreign body. the patient does not have any significant past medical history but is a smoker and smokes about a pack of cigarettes a day. The patient was prescribed Cefadroxil 500 mg twice a day for 7 days and given some dressing changes and asked to see the wound care. the patient was not referred to hand a Engineer, petroleum. 06/12/17  on evaluation today  patient appears to be doing decently well in regard to the injury only distal portion of his left middle finger. He is having some discomfort unfortunately. Nonetheless he has not seen the orthopedic specialist as of yet that we referred him to. He states he really wasn't sure if that was something that was necessary or not and subsequently he also states that he has had a difficult time getting in touch with the case manager and adjuster on his case over the holiday. Nonetheless he has continued to perform the dressing changes appropriately and the wound does seem to be showing signs of improvement which is excellent news. Upon review of the x-ray it does appear that the fracture of the tuft was minimal nonetheless I do think she needs orthopedic specialist to monitor this as far as ensuring complete healing and obviously rate and releasing the patient once everything is said and done. 06/19/17 on evaluation today patient's wound on the tip of his left middle finger appears to be doing much better. The Melburn Popper has been doing its job and a lot of the slough was very loose. At the very tip he did have a little bit of slough that was more adherent. There does not appear to be any infection and he actually has an appointment with [facility]oped[facility] tomorrow [facility] for evaluation in regard to the tuft fracture. He seems to still be having some pain from likely this fracture. 06/26/17 on evaluation today patient appears to be doing excellent in regard to his left middle finger ulcer. He has been tolerating the dressing changes without complication. [facility]. With that being said they did not see any evidence of fracture at this point according to what the patient is telling me although I do not have that note for review personally as of yet. Nonetheless it sounds like things are doing very well and the fact that there is no fracture is even  better. Again this had been noted on the initial report. He really does not have any significant slough today on evaluation. 07/03/17 on evaluation today patient appears to be doing excellent in regard to his finger ulcer. He has been tolerating the dressing changes without complication and continues to have greater and greater epithelialization week by week. Overall I'm very pleased with the progress that he has made. Unfortunately we still have not gotten the report from the orthopedic specialist that he saw. We did request this last week. 07/10/17 on evaluation today patient appears to be doing excellent in regard to his left middle finger ulcer. He has been tolerating the dressing changes without complication and overall this is showing signs of excellent epithelialization much better than even last week. Overall I'm extremely pleased with the progress he has made. Patient is likewise happy and continues to work full duty in fact he tells me that he is been able to sense the second day following the entry. He is having no [name], Hunter K. ([id number]) pain. 07/17/17 on evaluation today patient's ulcer on the left middle finger appears to be doing very well. He has been tolerating the dressing changes without complication. If anything it actually appears that there may be some dry skin around the area but there is only a very small central opening still noted at this point. Fortunately there's no evidence of infection, note purulent drainage, and patient is having no pain. Unfortunately he does have a migraine today. 07/24/17 on evaluation today patient  appears to be doing fairly well in regard to his finger ulcer. In fact this appears to be completely healed today which is excellent news. He has been tolerating the dressing changes without complication. He states that when he works he continues to wrap this even though it was believed by him as well to be healed. This again is the left middle  finger. He has continued to work full duty. He does have an appointment on the 21st of this month which in fact is this coming Thursday with orthopedics. Patient History Information obtained from Patient. Family History No family history of Cancer, Diabetes, Heart Disease, Hereditary Spherocytosis, Hypertension, Kidney Disease, Lung Disease, Seizures, Stroke, Thyroid Problems, Tuberculosis. Social History Current every day smoker - 1/2 pack to 1 pack, Marital Status - Single, Alcohol Use - Rarely, Drug Use - Current History - marijana occasionally, Caffeine Use - Daily. Review of Systems (ROS) Constitutional Symptoms (General Health) Denies complaints or symptoms of Fever, Chills. Respiratory The patient has no complaints or symptoms. Cardiovascular The patient has no complaints or symptoms. Psychiatric The patient has no complaints or symptoms. Objective Constitutional Well-nourished and well-hydrated in no acute distress. Vitals Time Taken: 2:28 PM, Height: 76 in, Weight: 235 lbs, BMI: 28.6, Temperature: 98.1 F, Pulse: 74 bpm, Respiratory Rate: 16 breaths/min, Blood Pressure: 146/92 mmHg. General Notes: Patient advised to follow up with PCP regarding elevated BP. Respiratory normal breathing without difficulty. Psychiatric this patient is able to make decisions and demonstrates good insight into disease process. Alert and Oriented x 3. pleasant and cooperative. [name], [name] ([id number]) General Notes: Patient's wound does show evidence of some dry skin surrounding the healed area but I was able to remove some of this safely. There was still a small portion of the dry skin which was more adherent and not easily removable in regard to the tip finger. For that reason I left this and recommended that patient utilize lotion as well as soaks in order to help loosen this up. Subsequently I think that over time it will work itself off. Nonetheless if he starts to notice an area that  is lifting up that suggestion would be to click this carefully with fingernail clippers in order to avoid it getting pulled but at the same time not to get too close to the healed skin. He understands. Integumentary (Hair, Skin) Wound #1 status is Healed - Epithelialized. Original cause of wound was Trauma. The wound is located on the Left Hand - 3rd Digit. The wound measures 0cm length x 0cm width x 0cm depth; 0cm^2 area and 0cm^3 volume. Patient's blood pressure was elevated today and I therefore did recommend that he follow up with his primary care provider regarding this. Assessment Active Problems ICD-10 S61.303A - Unspecified open wound of left middle finger with damage to nail, initial encounter S62.663B - Nondisplaced fracture of distal phalanx of left middle finger, initial encounter for open fracture F17.218 - Nicotine dependence, cigarettes, with other nicotine-induced disorders Plan Discharge From [facility] Services: Discharge from Wound Care Center - treatment compete At this point patient is healed from a wound care perspective and we are going to discontinue wound care services currently. He does have his appointment with orthopedics where they will performed the rate and release in regard to function with this finger. Very pleased that the patient did well and everything has healed appropriately he actually already has a nail going back in very nicely. We will see him in the future as needed if anything  changes or worsens. Electronic Signature(s) Signed: 07/25/2017 5:29:43 PM By: Lenda Kelp PA-C Previous Signature: 07/24/2017 5:42:41 PM Version By: Lenda Kelp PA-C Entered By: Lenda Kelp on 07/25/2017 17:29:33 [name] ([id number]) -------------------------------------------------------------------------------- ROS/PFSH Details Patient Name: [name] Date of Service: 07/24/2017 2:30 PM Medical Record Number: [id number] Patient Account Number:  [id number] Date of Birth/Sex: 02/08/1971 (47 y.o. Male) Treating RN: Huel Coventry Primary Care Provider: PATIENT, NO Other Clinician: Referring Provider: Janalyn Harder Treating Provider/Extender: Linwood Dibbles, Gardiner Espana Weeks in Treatment: 7 Information Obtained From Patient Wound History Do you currently have one or more open woundso Yes How many open wounds do you currently haveo 1 Approximately how long have you had your woundso 10 days How have you been treating your wound(s) until nowo dry bandage Has your wound(s) ever healed and then re-openedo No Have you had any lab work done in the past montho No Have you tested positive for an antibiotic resistant organism (MRSA, VRE)o No Have you tested positive for osteomyelitis (bone infection)o No Have you had any tests for circulation on your legso No Constitutional Symptoms (General Health) Complaints and Symptoms: Negative for: Fever; Chills Hematologic/Lymphatic Medical History: Negative for: Anemia; Hemophilia; Human Immunodeficiency Virus; Lymphedema; Sickle Cell Disease Respiratory Complaints and Symptoms: No Complaints or Symptoms Medical History: Negative for: Aspiration; Asthma; Chronic Obstructive Pulmonary Disease (COPD); Pneumothorax; Sleep Apnea; Tuberculosis Cardiovascular Complaints and Symptoms: No Complaints or Symptoms Medical History: Negative for: Angina; Arrhythmia; Congestive Heart Failure; Coronary Artery Disease; Deep Vein Thrombosis; Hypertension; Hypotension; Myocardial Infarction; Peripheral Arterial Disease; Peripheral Venous Disease; Phlebitis; Vasculitis Gastrointestinal Medical History: Negative for: Cirrhosis ; Colitis; Crohnos; Hepatitis A; Hepatitis B; Hepatitis C Endocrine [name], [name] ([id number]) Medical History: Negative for: Type I Diabetes; Type II Diabetes Genitourinary Medical History: Negative for: End Stage Renal Disease Immunological Medical History: Negative for: Lupus  Erythematosus; Raynaudos; Scleroderma Integumentary (Skin) Medical History: Negative for: History of Burn; History of pressure wounds Musculoskeletal Medical History: Negative for: Gout; Rheumatoid Arthritis; Osteoarthritis; Osteomyelitis Neurologic Medical History: Negative for: Dementia; Neuropathy; Quadriplegia; Paraplegia; Seizure Disorder Oncologic Medical History: Negative for: Received Chemotherapy; Received Radiation Psychiatric Complaints and Symptoms: No Complaints or Symptoms Medical History: Negative for: Anorexia/bulimia; Confinement Anxiety Immunizations Pneumococcal Vaccine: Received Pneumococcal Vaccination: Yes Tetanus Vaccine: Last tetanus shot: 05/23/2017 Implantable Devices Family and Social History Cancer: No; Diabetes: No; Heart Disease: No; Hereditary Spherocytosis: No; Hypertension: No; Kidney Disease: No; Lung Disease: No; Seizures: No; Stroke: No; Thyroid Problems: No; Tuberculosis: No; Current every day smoker - 1/2 pack to 1 pack; Marital Status - Single; Alcohol Use: Rarely; Drug Use: Current History - marijana occasionally; Caffeine Use: Daily; Financial Concerns: No; Food, Clothing or Shelter Needs: No; Support System Lacking: No; Transportation Concerns: No; Advanced Directives: No; Patient does not want information on Advanced Directives; Do not resuscitate: No; Living Will: No; Medical Power of Attorney: No Physician Affirmation I have reviewed and agree with the above information. [name], JANI ([id number]) Electronic Signature(s) Signed: 07/24/2017 5:42:41 PM By: Lenda Kelp PA-C Signed: 07/25/2017 8:36:08 AM By: Elliot Gurney, BSN, RN, CWS, Kim RN, BSN Entered By: Lenda Kelp on 07/24/2017 14:47:15 [name] ([id number]) -------------------------------------------------------------------------------- SuperBill Details Patient Name: KAYIN, OSMENT. Date of Service: 07/24/2017 Medical Record Number: [id number] Patient Account  Number: [id number] Date of Birth/Sex: [birth year] (47 y.o. Male) Treating RN: Huel Coventry Primary Care Provider: PATIENT, NO Other Clinician: Referring Provider: Janalyn Harder Treating Provider/Extender: Linwood Dibbles, Isebella Upshur Weeks in Treatment: 7 Diagnosis Coding ICD-10 Codes Code Description  Z61.096E Unspecified open wound of left middle finger with damage to nail, initial encounter S62.663B Nondisplaced fracture of distal phalanx of left middle finger, initial encounter for open fracture F17.218 Nicotine dependence, cigarettes, with other nicotine-induced disorders Facility Procedures CPT4 Code: [id number] Description: [phone] - WOUND CARE VISIT-LEV 2 EST PT Modifier: Quantity: 1 Physician Procedures CPT4: Description Modifier Quantity Code [id number] - WC PHYS LEVEL 2 - EST PT 1 ICD-10 Diagnosis Description S61.303A Unspecified open wound of left middle finger with damage to nail, initial encounter S62.663B Nondisplaced fracture of distal phalanx  of left middle finger, initial encounter for open fracture F17.218 Nicotine dependence, cigarettes, with other nicotine-induced disorders Electronic Signature(s) Signed: 07/24/2017 5:42:41 PM By: Lenda Kelp PA-C Entered By: Lenda Kelp on 07/24/2017 14:49:57

## 2019-06-11 ENCOUNTER — Ambulatory Visit: Payer: Self-pay | Attending: Internal Medicine

## 2019-06-11 DIAGNOSIS — Z20822 Contact with and (suspected) exposure to covid-19: Secondary | ICD-10-CM | POA: Insufficient documentation

## 2019-06-13 LAB — NOVEL CORONAVIRUS, NAA: SARS-CoV-2, NAA: NOT DETECTED

## 2019-06-14 ENCOUNTER — Telehealth: Payer: Self-pay

## 2019-06-14 NOTE — Telephone Encounter (Signed)
Pt notified of negative COVID-19 results. Understanding verbalized.  [name]   

## 2019-06-29 IMAGING — CR DG FINGER MIDDLE 2+V*L*
3 series · 3 of 3 positions shown · non-contrast
Comparison: None.

CLINICAL DATA: Pt caught the tip of his middle finger in machinery
today.

EXAM:
LEFT MIDDLE FINGER 2+V

[finger ap]
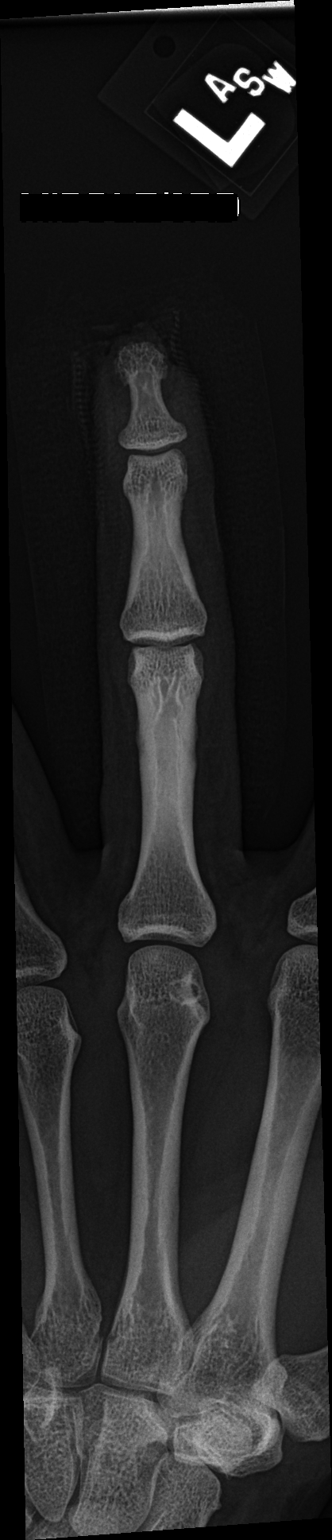

[finger obl]
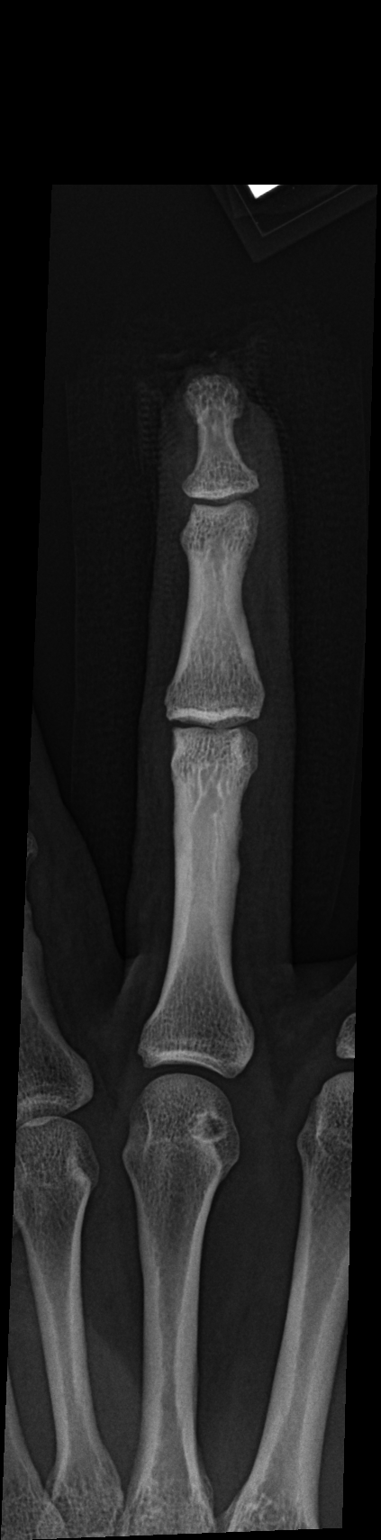

[finger lat]
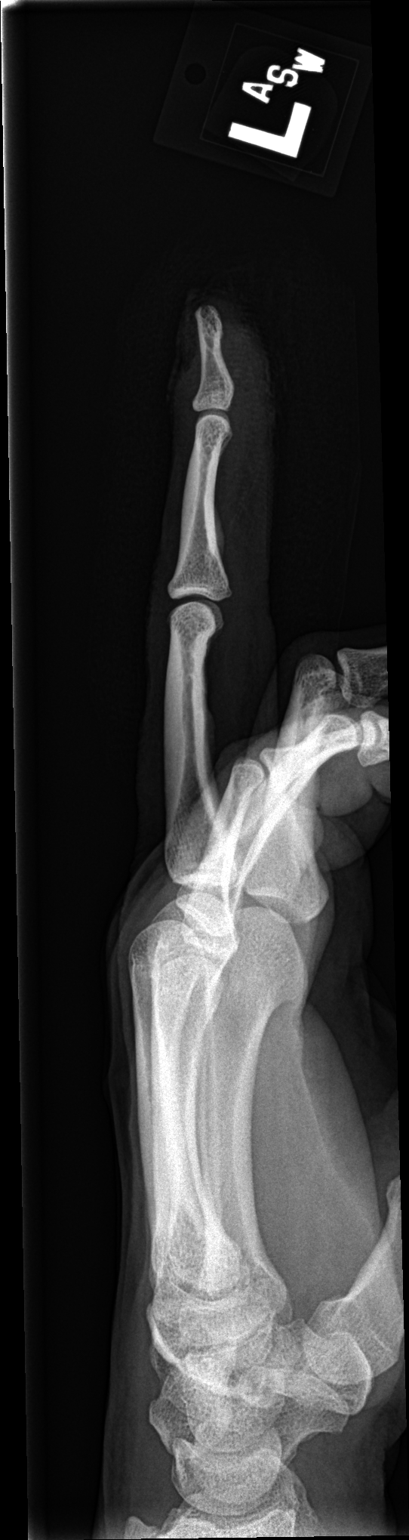

[3 of 3 positions shown; findings below may reference images not displayed]

FINDINGS: There is soft tissue loss irregularity of the finger tip. There is a
subtle loss of the white cortical line over the distal margin of the
distal tuft. No other fracture.

The joints are normally spaced and aligned.

No radiopaque foreign bodies.
IMPRESSION: 1. Middle finger distal soft tissue laceration with loss of soft
tissue to the finger tip. Subtle evidence of minimal fracturing of
the distal margin of the distal tuft. No other fracture. No
dislocation. No radiopaque foreign body.

## 2020-06-17 ENCOUNTER — Other Ambulatory Visit: Payer: Self-pay

## 2020-06-17 DIAGNOSIS — Z20822 Contact with and (suspected) exposure to covid-19: Secondary | ICD-10-CM

## 2020-06-19 LAB — SARS-COV-2, NAA 2 DAY TAT

## 2020-06-19 LAB — NOVEL CORONAVIRUS, NAA: SARS-CoV-2, NAA: NOT DETECTED
